# Patient Record
Sex: Female | Born: 1955
Health system: Southern US, Community
[De-identification: ages and names within clinical notes are randomized; demographics above are authoritative.]

## PROBLEM LIST (undated history)

## (undated) DIAGNOSIS — Z860100 Personal history of colon polyps, unspecified: Secondary | ICD-10-CM

## (undated) DIAGNOSIS — K219 Gastro-esophageal reflux disease without esophagitis: Secondary | ICD-10-CM

## (undated) DIAGNOSIS — Z8619 Personal history of other infectious and parasitic diseases: Secondary | ICD-10-CM

## (undated) DIAGNOSIS — Z87898 Personal history of other specified conditions: Secondary | ICD-10-CM

## (undated) DIAGNOSIS — R87619 Unspecified abnormal cytological findings in specimens from cervix uteri: Secondary | ICD-10-CM

## (undated) DIAGNOSIS — E039 Hypothyroidism, unspecified: Secondary | ICD-10-CM

## (undated) DIAGNOSIS — F419 Anxiety disorder, unspecified: Secondary | ICD-10-CM

## (undated) DIAGNOSIS — Z8719 Personal history of other diseases of the digestive system: Secondary | ICD-10-CM

## (undated) HISTORY — PX: TUBAL LIGATION: SHX77

## (undated) HISTORY — PX: BREAST BIOPSY: SHX20

## (undated) HISTORY — PX: WISDOM TOOTH EXTRACTION: SHX21

## (undated) HISTORY — DX: Hypothyroidism, unspecified: E03.9

## (undated) HISTORY — DX: Personal history of colon polyps, unspecified: Z86.0100

## (undated) HISTORY — DX: Unspecified abnormal cytological findings in specimens from cervix uteri: R87.619

## (undated) HISTORY — DX: Anxiety disorder, unspecified: F41.9

## (undated) HISTORY — DX: Personal history of colonic polyps: Z86.010

## (undated) HISTORY — DX: Personal history of other specified conditions: Z87.898

## (undated) HISTORY — DX: Personal history of other infectious and parasitic diseases: Z86.19

## (undated) HISTORY — PX: COLONOSCOPY: SHX174

---
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
## 1986-09-16 HISTORY — PX: LAPAROTOMY: SHX154

## 2005-02-12 ENCOUNTER — Ambulatory Visit: Payer: Self-pay

## 2005-02-14 ENCOUNTER — Ambulatory Visit: Payer: Self-pay

## 2006-02-20 ENCOUNTER — Ambulatory Visit: Payer: Self-pay

## 2006-11-15 HISTORY — PX: CHOLECYSTECTOMY: SHX55

## 2006-12-09 ENCOUNTER — Inpatient Hospital Stay: Payer: Self-pay | Admitting: Surgery

## 2007-04-16 ENCOUNTER — Ambulatory Visit: Payer: Self-pay

## 2008-04-19 ENCOUNTER — Ambulatory Visit: Payer: Self-pay

## 2009-04-21 ENCOUNTER — Ambulatory Visit: Payer: Self-pay

## 2010-04-25 ENCOUNTER — Ambulatory Visit: Payer: Self-pay

## 2010-09-16 DIAGNOSIS — Z8719 Personal history of other diseases of the digestive system: Secondary | ICD-10-CM

## 2010-09-16 HISTORY — DX: Personal history of other diseases of the digestive system: Z87.19

## 2011-04-30 ENCOUNTER — Ambulatory Visit: Payer: Self-pay | Admitting: Unknown Physician Specialty

## 2011-04-30 LAB — HM PAP SMEAR

## 2011-05-24 ENCOUNTER — Ambulatory Visit: Payer: Self-pay | Admitting: Unknown Physician Specialty

## 2011-05-28 LAB — PATHOLOGY REPORT

## 2011-07-16 ENCOUNTER — Ambulatory Visit: Payer: Self-pay

## 2011-07-16 LAB — HM MAMMOGRAPHY: HM MAMMO: NEGATIVE

## 2011-09-17 HISTORY — PX: OTHER SURGICAL HISTORY: SHX169

## 2012-04-06 ENCOUNTER — Ambulatory Visit: Payer: Self-pay | Admitting: Otolaryngology

## 2012-04-16 ENCOUNTER — Ambulatory Visit: Payer: Self-pay | Admitting: Otolaryngology

## 2012-04-29 LAB — HM PAP SMEAR

## 2012-04-30 ENCOUNTER — Ambulatory Visit: Payer: Self-pay | Admitting: Internal Medicine

## 2012-07-02 ENCOUNTER — Ambulatory Visit: Payer: Self-pay | Admitting: Internal Medicine

## 2012-07-06 ENCOUNTER — Ambulatory Visit: Payer: Self-pay | Admitting: Otolaryngology

## 2012-07-14 ENCOUNTER — Ambulatory Visit: Payer: Self-pay | Admitting: Internal Medicine

## 2012-07-27 ENCOUNTER — Ambulatory Visit: Payer: Self-pay

## 2013-06-08 LAB — HM PAP SMEAR

## 2013-06-10 ENCOUNTER — Encounter: Payer: Self-pay | Admitting: Internal Medicine

## 2013-06-10 ENCOUNTER — Ambulatory Visit (INDEPENDENT_AMBULATORY_CARE_PROVIDER_SITE_OTHER): Payer: 59 | Admitting: Internal Medicine

## 2013-06-10 VITALS — BP 120/70 | HR 57 | Temp 97.7°F | Ht 69.75 in | Wt 173.0 lb

## 2013-06-10 DIAGNOSIS — Z87898 Personal history of other specified conditions: Secondary | ICD-10-CM

## 2013-06-10 DIAGNOSIS — Z803 Family history of malignant neoplasm of breast: Secondary | ICD-10-CM

## 2013-06-10 DIAGNOSIS — Z86010 Personal history of colonic polyps: Secondary | ICD-10-CM

## 2013-06-10 DIAGNOSIS — Z8742 Personal history of other diseases of the female genital tract: Secondary | ICD-10-CM

## 2013-06-10 DIAGNOSIS — E039 Hypothyroidism, unspecified: Secondary | ICD-10-CM

## 2013-06-13 ENCOUNTER — Encounter: Payer: Self-pay | Admitting: Internal Medicine

## 2013-06-13 DIAGNOSIS — E039 Hypothyroidism, unspecified: Secondary | ICD-10-CM | POA: Insufficient documentation

## 2013-06-13 DIAGNOSIS — Z86010 Personal history of colonic polyps: Secondary | ICD-10-CM | POA: Insufficient documentation

## 2013-06-13 DIAGNOSIS — Z803 Family history of malignant neoplasm of breast: Secondary | ICD-10-CM | POA: Insufficient documentation

## 2013-06-13 DIAGNOSIS — R87619 Unspecified abnormal cytological findings in specimens from cervix uteri: Secondary | ICD-10-CM | POA: Insufficient documentation

## 2013-06-13 NOTE — Assessment & Plan Note (Signed)
Sister with breast cancer.  No other family members with breast cancer.  Scheduled for mammogram 07/27/13.

## 2013-06-13 NOTE — Assessment & Plan Note (Signed)
Last colonoscopy 2013.  One polyp.  Recommended a follow up colonoscopy in five years.     

## 2013-06-13 NOTE — Progress Notes (Signed)
  Subjective:    Patient ID: Meagan Calhoun, female    DOB: 01/05/1956, 57 y.o.   MRN: [id number]  HPI 57 year old female with past history of colonic polyps and hypothyroidism (s/p partial right thyroidectomy) who comes in today to follow up on these issues as well as to establish care.  Previously was seeing Dr Alison Murray and Dr Kerrie Pleasure.  She states she stays active.  Walking at lunch.  Is concerned regarding her weight.  States was 165 pounds last year.  No chest pain or tightness.  No sob.  Breathing stable.  No nausea or vomiting.  No bowel change.  Previously colonoscopy 2013 - one polyp.  Recommend f/u colonoscopy in five years.  Has a history of previous abnormal pap smear.  Repeat ok.  LMP five years ago.  Last mammogram 07/2012.  Scheduled for follow up mammogram 07/27/13.  Since her thyroid surgery her swallowing has improved.  She still occasionally has some issues with feeling a little pressure when swallowing.     Past Medical History  Diagnosis Date  . History of chicken pox   . H/O jaundice   . Hypothyroidism   . Hx of colonic polyp     Outpatient Encounter Prescriptions as of 06/10/2013  Medication Sig Dispense Refill  . Biotin 10 MG CAPS Take by mouth.      . calcium citrate (CALCITRATE - DOSED IN MG ELEMENTAL CALCIUM) 950 MG tablet Take 1 tablet by mouth daily.      . fish oil-omega-3 fatty acids 1000 MG capsule Take 2 g by mouth daily.      Marland Kitchen levothyroxine (SYNTHROID, LEVOTHROID) 100 MCG tablet Take 100 mcg by mouth daily before breakfast.      . Multiple Vitamin (MULTIVITAMIN) tablet Take 1 tablet by mouth daily.       No facility-administered encounter medications on file as of 06/10/2013.    Review of Systems Patient denies any headache, lightheadedness or dizziness.  No sinus or allergy symptoms.  No chest pain, tightness or palpitations.  No increased shortness of breath, cough or congestion.  No nausea or vomiting.  No acid reflux.  No abdominal pain or cramping.   No bowel change, such as diarrhea, constipation, BRBPR or melana.  No urine change.  Walking at lunch.  Up to date with her pelvics/paps.  Scheduled for her mammogram 07/27/13.        Objective:   Physical Exam Filed Vitals:   06/10/13 1003  BP: 120/70  Pulse: 57  Temp: 97.7 F (37.32 C)   57 year old female in no acute distress.   HEENT:  Nares- clear.  Oropharynx - without lesions. NECK:  Supple.  Nontender.  No audible bruit.  HEART:  Appears to be regular. LUNGS:  No crackles or wheezing audible.  Respirations even and unlabored.  RADIAL PULSE:  Equal bilaterally.  ABDOMEN:  Soft, nontender.  Bowel sounds present and normal.  No audible abdominal bruit.   EXTREMITIES:  No increased edema present.  DP pulses palpable and equal bilaterally.      Assessment & Plan:  HEALTH MAINTENANCE.  States up to date with her pelvic/paps.  Scheduled for her mammogram 07/27/13.  Colonoscopy 2013 - one polyp.  Recommended follow up colonoscopy in five years.    I spent 45 minutes with the patient and more than 50% of the time was spent in consultation regarding the above.

## 2013-06-13 NOTE — Assessment & Plan Note (Signed)
Previous history of abnormal pap.  Repeat ok.  Last pap wnl.  Up to date.

## 2013-06-13 NOTE — Assessment & Plan Note (Signed)
S/p partial right thyroidectomy.  On thyroid replacement.  Check tsh.

## 2013-06-15 ENCOUNTER — Telehealth: Payer: Self-pay | Admitting: Internal Medicine

## 2013-06-15 NOTE — Telephone Encounter (Signed)
Pt notified of [facility] lab results via my chart.

## 2013-06-17 NOTE — Telephone Encounter (Signed)
Mailed unread message to pt  

## 2013-06-22 ENCOUNTER — Encounter: Payer: Self-pay | Admitting: Internal Medicine

## 2013-06-29 ENCOUNTER — Encounter: Payer: Self-pay | Admitting: Internal Medicine

## 2013-07-04 ENCOUNTER — Encounter: Payer: Self-pay | Admitting: Internal Medicine

## 2013-07-04 ENCOUNTER — Telehealth: Payer: Self-pay | Admitting: Internal Medicine

## 2013-07-04 NOTE — Telephone Encounter (Signed)
Pt notified thyroid function test within normal limits and to stay on current dose of thyorid medication.   Dr Lorin Picket

## 2013-07-22 ENCOUNTER — Other Ambulatory Visit: Payer: Self-pay

## 2013-08-16 HISTORY — PX: CERVICAL BIOPSY  W/ LOOP ELECTRODE EXCISION: SUR135

## 2013-10-11 ENCOUNTER — Ambulatory Visit (INDEPENDENT_AMBULATORY_CARE_PROVIDER_SITE_OTHER): Payer: 59 | Admitting: Internal Medicine

## 2013-10-11 ENCOUNTER — Encounter: Payer: Self-pay | Admitting: Internal Medicine

## 2013-10-11 VITALS — BP 110/70 | HR 60 | Temp 98.2°F | Ht 69.75 in | Wt 172.5 lb

## 2013-10-11 DIAGNOSIS — E039 Hypothyroidism, unspecified: Secondary | ICD-10-CM

## 2013-10-11 DIAGNOSIS — Z803 Family history of malignant neoplasm of breast: Secondary | ICD-10-CM

## 2013-10-11 DIAGNOSIS — Z86010 Personal history of colonic polyps: Secondary | ICD-10-CM

## 2013-10-11 NOTE — Progress Notes (Signed)
Pre-visit discussion using our clinic review tool. No additional management support is needed unless otherwise documented below in the visit note.  

## 2013-10-11 NOTE — Assessment & Plan Note (Addendum)
Last colonoscopy 2013.  One polyp.  Recommended a follow up colonoscopy in five years.

## 2013-10-11 NOTE — Assessment & Plan Note (Signed)
Sister with breast cancer.  No other family members with breast cancer.  Had her mammogram 07/27/13.  States ok.  Obtain results.

## 2013-10-11 NOTE — Assessment & Plan Note (Signed)
S/p partial right thyroidectomy.  On thyroid replacement.  Last tsh 10/14 wnl.  Recheck tsh in 6 months to confirm stable.

## 2013-10-11 NOTE — Assessment & Plan Note (Signed)
[facility] in 11/14 abnormal.  S/p LEEP.  Ok.  Is scheduled a f/u pap in 3/15.  Obtain records.

## 2013-10-11 NOTE — Progress Notes (Signed)
  Subjective:    Patient ID: Meagan Calhoun, female    DOB: 1955-11-08, 58 y.o.   MRN: [id number]  HPI 58 year old female with past history of colonic polyps and hypothyroidism (s/p partial right thyroidectomy) who comes in today for a scheduled follow up.  She states she stays active.  Walking at lunch.  Is concerned regarding her weight.  States was 165 pounds last year.  She has just started watching her diet.  Has decreased her carb intake and her soda intake.  Feels good.  No chest pain or tightness.  No sob.  Breathing stable.  No nausea or vomiting.  No bowel change.  Previously colonoscopy 2013 - one polyp.  Recommend f/u colonoscopy in five years.  LMP five years ago.  Last mammogram 07/2013.  [facility].  Just had a pap smear in 11/14.  Was abnormal.  Is s/p LEEP.  Ok.  Scheduled a f/u pap in 3/15.  No vaginal problems.     Past Medical History  Diagnosis Date  . History of chicken pox   . H/O jaundice   . Hypothyroidism   . Hx of colonic polyp     Outpatient Encounter Prescriptions as of 10/11/2013  Medication Sig  . Biotin 10 MG CAPS Take by mouth.  . calcium citrate (CALCITRATE - DOSED IN MG ELEMENTAL CALCIUM) 950 MG tablet Take 1 tablet by mouth daily.  . fish oil-omega-3 fatty acids 1000 MG capsule Take 2 g by mouth daily.  Marland Kitchen. levothyroxine (SYNTHROID, LEVOTHROID) 100 MCG tablet Take 100 mcg by mouth daily before breakfast.  . Multiple Vitamin (MULTIVITAMIN) tablet Take 1 tablet by mouth daily.    Review of Systems Patient denies any headache, lightheadedness or dizziness.  No sinus or allergy symptoms.  No chest pain, tightness or palpitations.  No increased shortness of breath, cough or congestion.  No nausea or vomiting.  No acid reflux.  No abdominal pain or cramping.  No bowel change, such as diarrhea, constipation, BRBPR or melana.  No urine change.  Walking at lunch.  Up to date with her pelvics/paps.  Last mammogram 07/27/13.        Objective:   Physical Exam  Filed Vitals:   10/11/13 0809  BP: 110/70  Pulse: 60  Temp: 98.2 F (3736.188 C)   58 year old female in no acute distress.   HEENT:  Nares- clear.  Oropharynx - without lesions. NECK:  Supple.  Nontender.  No audible bruit.  HEART:  Appears to be regular. LUNGS:  No crackles or wheezing audible.  Respirations even and unlabored.  RADIAL PULSE:  Equal bilaterally.  ABDOMEN:  Soft, nontender.  Bowel sounds present and normal.  No audible abdominal bruit.   EXTREMITIES:  No increased edema present.  DP pulses palpable and equal bilaterally.      Assessment & Plan:  HEALTH MAINTENANCE.  Up to date with her pelvic/paps.  Last mammogram 07/27/13.  Colonoscopy 2013 - one polyp.  Recommended follow up colonoscopy in five years.

## 2013-11-03 ENCOUNTER — Encounter: Payer: Self-pay | Admitting: Internal Medicine

## 2013-12-20 ENCOUNTER — Telehealth: Payer: Self-pay | Admitting: Internal Medicine

## 2013-12-20 NOTE — Telephone Encounter (Signed)
Pt states she pulled a tick off Friday afternoon.  Not sick, does not feel sick, no temperature.  Starting last night she feels like no energy, aching feeling, freezing (sitting on heating pad at work due to being cold).  Asking for advice, wondering if symptoms would start that soon?  Call at work

## 2013-12-20 NOTE — Telephone Encounter (Signed)
Pt notified & stated that [facility] will [facility]

## 2013-12-20 NOTE — Telephone Encounter (Signed)
If aching and freezing and tick exposure - needs evaluation.  I am leaving early today - needs evaluation if can be worked in somewhere or acute care - just to confirm etiology.

## 2013-12-20 NOTE — Telephone Encounter (Signed)
Please advise 

## 2014-03-02 ENCOUNTER — Other Ambulatory Visit: Payer: Self-pay | Admitting: *Deleted

## 2014-03-02 MED ORDER — LEVOTHYROXINE SODIUM 100 MCG PO TABS: 100.0000 ug | ORAL_TABLET | Freq: Every day | ORAL | Status: DC

## 2014-04-11 ENCOUNTER — Other Ambulatory Visit: Payer: 59

## 2014-06-02 ENCOUNTER — Other Ambulatory Visit: Payer: Self-pay | Admitting: Internal Medicine

## 2014-07-29 LAB — HM MAMMOGRAPHY: HM Mammogram: NEGATIVE

## 2014-08-04 LAB — HM PAP SMEAR: HM Pap smear: NEGATIVE

## 2014-09-21 ENCOUNTER — Other Ambulatory Visit: Payer: Self-pay | Admitting: Internal Medicine

## 2014-10-12 ENCOUNTER — Encounter: Payer: 59 | Admitting: Internal Medicine

## 2014-10-24 ENCOUNTER — Encounter: Payer: Self-pay | Admitting: Internal Medicine

## 2014-10-24 ENCOUNTER — Ambulatory Visit: Payer: Self-pay | Admitting: Internal Medicine

## 2014-10-24 ENCOUNTER — Ambulatory Visit (INDEPENDENT_AMBULATORY_CARE_PROVIDER_SITE_OTHER): Payer: 59 | Admitting: Internal Medicine

## 2014-10-24 VITALS — BP 100/65 | HR 51 | Temp 98.1°F | Ht 70.5 in | Wt 172.0 lb

## 2014-10-24 DIAGNOSIS — R1310 Dysphagia, unspecified: Secondary | ICD-10-CM

## 2014-10-24 DIAGNOSIS — Z803 Family history of malignant neoplasm of breast: Secondary | ICD-10-CM

## 2014-10-24 DIAGNOSIS — R87619 Unspecified abnormal cytological findings in specimens from cervix uteri: Secondary | ICD-10-CM

## 2014-10-24 DIAGNOSIS — Z86010 Personal history of colonic polyps: Secondary | ICD-10-CM

## 2014-10-24 DIAGNOSIS — Z0000 Encounter for general adult medical examination without abnormal findings: Secondary | ICD-10-CM

## 2014-10-24 DIAGNOSIS — E0789 Other specified disorders of thyroid: Secondary | ICD-10-CM

## 2014-10-24 NOTE — Progress Notes (Signed)
Patient ID: Meagan Calhoun, female   DOB: [birth year], 59 y.o.   MRN: [id number]   Subjective:    Patient ID: Meagan Danielatricia E Kmetz, female    DOB: [birth year], 59 y.o.   MRN: [id number]  HPI  Patient on the schedule for a physical exam.  Sees gyn for breast and pelvic exams.  Here to f/u regarding her thyroid, etc.  States she has noticed difficulty swallowing.  Had an episode recently where bread did not go down.  Also had trouble with fluid not going down.  Has some trouble intermittently.  More trouble with food with more substance.  Has started eating slowly and taking small bites.  Stays active.  No cardiac symptoms with increased activity or exertion.  Bowels stable.     Past Medical History  Diagnosis Date  . History of chicken pox   . H/O jaundice   . Hypothyroidism   . Hx of colonic polyp     Current Outpatient Prescriptions on File Prior to Visit  Medication Sig Dispense Refill  . Biotin 10 MG CAPS Take by mouth.    . levothyroxine (SYNTHROID, LEVOTHROID) 100 MCG tablet Take 1 tablet (100 mcg  total) by mouth daily  before breakfast. 30 tablet 0  . Multiple Vitamin (MULTIVITAMIN) tablet Take 1 tablet by mouth daily.     No current facility-administered medications on file prior to visit.    Review of Systems  Constitutional: Negative for fatigue and unexpected weight change.  HENT: Negative for congestion and sinus pressure.   Respiratory: Negative for cough, chest tightness and shortness of breath.   Cardiovascular: Negative for chest pain, palpitations and leg swelling.  Gastrointestinal: Negative for nausea, vomiting, abdominal pain, diarrhea and constipation.       Has noticed difficulty swallowing as outlined.    Musculoskeletal: Negative for back pain and joint swelling.  Skin: Negative for color change and rash.  Neurological: Negative for dizziness, light-headedness and headaches.  Hematological: Negative for adenopathy. Does not bruise/bleed easily.    Psychiatric/Behavioral: Negative for behavioral problems. The patient is not nervous/anxious.        Objective:    Physical Exam  Constitutional: She appears well-developed and well-nourished. No distress.  HENT:  Nose: Nose normal.  Mouth/Throat: Oropharynx is clear and moist.  Neck: Neck supple. Thyromegaly (appears to have increased fullness - thyroid.  ) present.  Cardiovascular: Normal rate and regular rhythm.   Pulmonary/Chest: Breath sounds normal. No respiratory distress. She has no wheezes.  Abdominal: Soft. Bowel sounds are normal. There is no tenderness.  Musculoskeletal: She exhibits no edema or tenderness.  Lymphadenopathy:    She has no cervical adenopathy.  Skin: Skin is warm. No rash noted.    BP 100/65 mmHg  Pulse 51  Temp(Src) 98.1 F (36.7 C) (Oral)  Ht 5' 10.5" (1.791 m)  Wt 172 lb (78.019 kg)  BMI 24.32 kg/m2  SpO2 100% Wt Readings from Last 3 Encounters:  10/24/14 172 lb (78.019 kg)  10/11/13 172 lb 8 oz (78.245 kg)  06/10/13 173 lb (78.472 kg)     No results found for: WBC, HGB, HCT, PLT, GLUCOSE, CHOL, TRIG, HDL, LDLDIRECT, LDLCALC, ALT, AST, NA, K, CL, CREATININE, BUN, CO2, TSH, PSA, INR, GLUF, HGBA1C, MICROALBUR     Assessment & Plan:   Problem List Items Addressed This Visit    Abnormal Pap smear of cervix    [facility] is [facility].  States she is up to date.  Obtain records.  States  everything checked out fine.        Difficulty swallowing    Is s/p partial thyroidectomy.  Now noticing difficulty swallowing.  Occurs intermittently.  Had an episode of bread not "going down".  Also noticed with chicken and foods with increased substance.  Previously also noticed with fluid.  Obtain thyroid ultrasound.  Further w/up pending results.  Instructed to eat slowly, take small bites and chew food well.        Family history of breast cancer    Sister with breast cancer.  Stats up to date.  Obtain records.  States everything checked out fine.         Health care maintenance    Physical today.  Obtain records from gyn.  Up to date with pelvic and pap and mammogram.        History of colonic polyps    Last colonoscopy 2013.  One polyp.  Recommended a f/u colonoscopy in five years.        Thyroid fullness - Primary   Relevant Orders   US Soft Tissue Head/Neck     I spent 25 minutes with the patient and more than 50% of the time was spent in consultation regarding the above.     Dale Ellis, MD

## 2014-10-24 NOTE — Assessment & Plan Note (Signed)
[facility] is [facility].  States she is up to date.  Obtain records.  States everything checked out fine.

## 2014-10-24 NOTE — Assessment & Plan Note (Signed)
Sister with breast cancer.  Stats up to date.  Obtain records.  States everything checked out fine.

## 2014-10-24 NOTE — Assessment & Plan Note (Signed)
Physical today.  Obtain records from gyn.  Up to date with pelvic and pap and mammogram.

## 2014-10-24 NOTE — Assessment & Plan Note (Signed)
Is s/p partial thyroidectomy.  Now noticing difficulty swallowing.  Occurs intermittently.  Had an episode of bread not "going down".  Also noticed with chicken and foods with increased substance.  Previously also noticed with fluid.  Obtain thyroid ultrasound.  Further w/up pending results.  Instructed to eat slowly, take small bites and chew food well.

## 2014-10-24 NOTE — Assessment & Plan Note (Signed)
Last colonoscopy 2013.  One polyp.  Recommended a f/u colonoscopy in five years.

## 2014-10-24 NOTE — Progress Notes (Signed)
Pre visit review using our clinic review tool, if applicable. No additional management support is needed unless otherwise documented below in the visit note. 

## 2014-10-26 ENCOUNTER — Encounter: Payer: Self-pay | Admitting: Internal Medicine

## 2014-10-27 ENCOUNTER — Telehealth: Payer: Self-pay | Admitting: Internal Medicine

## 2014-10-27 ENCOUNTER — Other Ambulatory Visit: Payer: Self-pay | Admitting: *Deleted

## 2014-10-27 ENCOUNTER — Encounter: Payer: Self-pay | Admitting: *Deleted

## 2014-10-27 DIAGNOSIS — E041 Nontoxic single thyroid nodule: Secondary | ICD-10-CM

## 2014-10-27 MED ORDER — LEVOTHYROXINE SODIUM 125 MCG PO TABS: 125.0000 ug | ORAL_TABLET | Freq: Every day | ORAL | Status: DC

## 2014-10-27 NOTE — Telephone Encounter (Signed)
Pt notified of thyroid ultrasound results via my chart.  (left thyroid nodule).  Refer to endocrinology.

## 2014-10-27 NOTE — Telephone Encounter (Signed)
LMTCB & sent mychart message 

## 2014-10-27 NOTE — Telephone Encounter (Signed)
Pt has my chart, but please call her and notify her that her hgb, kidney function tests and liver function tests are wnl.  Cholesterol looks good.  Thyroid test (TSH) is elevated.  Need to confirm pt is taking her synthroid daily and taking correctly.  If so, increase the synthroid to q day.  Will need recheck tsh in 6 weeks.  I can provide another order for labs if needs.

## 2014-10-27 NOTE — Telephone Encounter (Signed)
Order placed for endocrinology referral.  

## 2014-10-27 NOTE — Telephone Encounter (Signed)
Pt notified of results. [facility] & mailed lab order to recheck TSH in 6 weeks. Pt also states that she would like to proceed with endocrinology referral. Prefers an early morning appt.

## 2014-10-29 NOTE — Addendum Note (Signed)
Addended by: Charm BargesSCOTT, Zerrick Hanssen S on: 10/29/2014 05:37 PM   Modules accepted: Orders

## 2014-10-29 NOTE — Telephone Encounter (Signed)
Order placed for referral to endocrinology.  

## 2014-11-01 NOTE — Telephone Encounter (Signed)
I spoke with patient & she reports that she has been taking the thyroid medication incorrectly. She has been taking it in the morning with the rest of her meds & vitamins. She will start tomorrow taking it by itself one hour prior to any food or other medications.

## 2014-11-02 ENCOUNTER — Other Ambulatory Visit: Payer: Self-pay | Admitting: *Deleted

## 2014-11-02 ENCOUNTER — Encounter: Payer: Self-pay | Admitting: Internal Medicine

## 2014-11-02 MED ORDER — LEVOTHYROXINE SODIUM 112 MCG PO TABS: 112.0000 ug | ORAL_TABLET | Freq: Every day | ORAL | Status: DC

## 2014-11-03 ENCOUNTER — Encounter: Payer: Self-pay | Admitting: Internal Medicine

## 2015-01-03 NOTE — Op Note (Signed)
PATIENT NAME:  Meagan Calhoun, FEAZELL MR#:  [id number] DATE OF BIRTH:  Jul 05, 1956  DATE OF PROCEDURE:  07/06/2012  PREOPERATIVE DIAGNOSIS: Right thyroid goiter.  POSTOPERATIVE DIAGNOSIS: Right thyroid goiter.   PROCEDURE PERFORMED: Minimally invasive right hemithyroidectomy with laryngeal nerve monitoring.   SURGEON: Kyung Rudd, MD   ASSISTANT:  Fredrich Birks, M.D.  ANESTHESIA: General endotracheal anesthesia.   ESTIMATED BLOOD LOSS: 25 mL.   IV FLUIDS: Please see anesthesia record.   COMPLICATIONS: None.   DRAINS/STENT PLACEMENTS: Surgicel.   SPECIMENS: Right hemithyroid and pyramidal lobe.   INDICATIONS FOR PROCEDURE: The patient is a 59 year old female with history of dysphagia and right-sided thyroid nodule on ultrasound.   OPERATIVE FINDINGS: Significantly enlarged thyroid gland with firm right-sided nodule. Right recurrent laryngeal nerve was identified and preserved. Right inferior and superior parathyroid glands were identified and preserved.   DESCRIPTION OF PROCEDURE: After the patient was identified in holding, the benefits and risks of the procedure were discussed and consent was reviewed, the patient was taken to the operating room and placed in the supine position. General endotracheal anesthesia was induced with laryngeal nerve monitoring. Care was taken to ensure proper placement of the electrodes. At this time the patient was prepped and draped in a sterile fashion. A previously marked anterior neck crease approximately two fingerbreadths above the sternal notch was injected with 5 mL of 0.25% Sensorcaine with 1:200,000 epinephrine and shoulder roll was placed. A 15 blade scalpel was used to make a skin incision. Dissection was carried through subcutaneous tissues down to the level of the strap muscles. These were divided vertically along the median raphe from the thyroid notch down to the sternum. The sternohyoid and sternothyroid muscles were separated from  the right hemithyroid. This demonstrated large goiter, right hemithyroid gland with fibrotic scarring around it. Dissection was carried laterally, inferiorly, and superiorly. A large parametal lobe was also identified coming off of the isthmus. At this time, a cleft between the trachea and the superior thyroid lobe was created using blunt techniques. The right superior pole vessels were isolated and pedicled and Harmonic scalpel was used to ligate right superior pole vessels. At this time the remaining attachments of the right hemithyroid superiorly and inferiorly were bluntly dissected. The middle thyroid vein was ligated. Berry's ligament was encountered and the right recurrent laryngeal nerve was identified coursed from lateral to medial in expected position. The stimulation of the nerve was performed to ensure that this was correct. The remaining attachments of the right hemithyroid and Berry's ligament were next divided using a combination of Harmonic scalpel and bipolar cautery and the remaining attachments of the right hemithyroid were separated from the patient's trachea using bipolar and Bovie electrocautery. At this time the patient's isthmus was divided using Bovie electrocautery and hemostasis was achieved using Bovie electrocautery. The specimen of the right hemithyroid and isthmus was passed off the table for permanent pathological evaluation. The patient's wound was copiously irrigated. The inferior parathyroid gland was identified in its normal anatomical position. Right superior parathyroid gland was identified and the recurrent laryngeal nerve continued to stimulate robustly. Surgicel was placed along the thyroid bed. Meticulous hemostasis was achieved. The strap muscles were reapproximated in a figure-of-eight fashion and then the skin was reapproximated using subdermal stitches in an interrupted fashion with 4-0 Vicryl. Skin was closed with Dermabond skin adhesive and topped with a Steri-Strip.    At this time, care of the patient was transferred to anesthesia where the patient tolerated the procedure  well and was taken to PAC-U in good condition.   ____________________________ Kyung Ruddreighton C. Reiley Bertagnolli, MD ccv:drc D: 07/06/2012 11:50:38 ET T: 07/06/2012 12:07:22 ET JOB#: [id number]  cc: Kyung Ruddreighton C. Reagyn Facemire, MD, <Dictator> Kyung RuddREIGHTON C Andrina Locken MD ELECTRONICALLY SIGNED 07/07/2012 9:38

## 2015-01-16 ENCOUNTER — Encounter: Payer: Self-pay | Admitting: Internal Medicine

## 2015-01-23 ENCOUNTER — Telehealth: Payer: Self-pay

## 2015-01-23 NOTE — Telephone Encounter (Signed)
Spoke with patient & informed her that we haven't received any labs since February. Pt states that she has a [facility] form that she found & realized that she forgot to get her TSH rechecked. Pt states that she will get it taken care of this week.

## 2015-01-23 NOTE — Telephone Encounter (Signed)
The patient is hoping to find out information about her lab work due to an upcoming biopsy (she is having it done on Wednesday)

## 2015-01-25 LAB — TSH: TSH: 1.13 u[IU]/mL (ref 0.41–5.90)

## 2015-01-26 ENCOUNTER — Encounter: Payer: Self-pay | Admitting: Internal Medicine

## 2015-02-01 ENCOUNTER — Telehealth: Payer: Self-pay | Admitting: Internal Medicine

## 2015-02-01 NOTE — Telephone Encounter (Signed)
Pt notified of normal tsh via my chart.  

## 2015-02-02 ENCOUNTER — Telehealth: Payer: Self-pay

## 2015-02-02 NOTE — Telephone Encounter (Signed)
The patient called and is hoping to find out what her next steps should be after her thyroid biopsy.  If she needs a follow up apt, when and where do you want her worked in?   Pt callback - [phone]

## 2015-02-02 NOTE — Telephone Encounter (Signed)
As far as f/u regarding her thyroid, Dr Tedd SiasSolum will dictate when due.  She did her biopsy.  I would like for her to schedule a f/u with me in 1-2 months (30min) - to f/u on her other issues.  Thanks

## 2015-02-02 NOTE — Telephone Encounter (Signed)
Left patient a message.

## 2015-03-15 ENCOUNTER — Other Ambulatory Visit: Payer: Self-pay | Admitting: Internal Medicine

## 2015-05-12 ENCOUNTER — Encounter: Payer: Self-pay | Admitting: Internal Medicine

## 2015-05-12 ENCOUNTER — Ambulatory Visit (INDEPENDENT_AMBULATORY_CARE_PROVIDER_SITE_OTHER): Payer: 59 | Admitting: Internal Medicine

## 2015-05-12 VITALS — BP 100/70 | HR 53 | Temp 98.1°F | Ht 70.5 in | Wt 167.1 lb

## 2015-05-12 DIAGNOSIS — R87619 Unspecified abnormal cytological findings in specimens from cervix uteri: Secondary | ICD-10-CM | POA: Diagnosis not present

## 2015-05-12 DIAGNOSIS — E0789 Other specified disorders of thyroid: Secondary | ICD-10-CM | POA: Diagnosis not present

## 2015-05-12 DIAGNOSIS — R1310 Dysphagia, unspecified: Secondary | ICD-10-CM

## 2015-05-12 DIAGNOSIS — E039 Hypothyroidism, unspecified: Secondary | ICD-10-CM

## 2015-05-12 DIAGNOSIS — Z86010 Personal history of colonic polyps: Secondary | ICD-10-CM

## 2015-05-12 DIAGNOSIS — Z803 Family history of malignant neoplasm of breast: Secondary | ICD-10-CM

## 2015-05-12 NOTE — Progress Notes (Signed)
Patient ID: Meagan Calhoun, female   DOB: 05-10-1956, 59 y.o.   MRN: [id number]   Subjective:    Patient ID: Meagan Calhoun, female    DOB: 1956-05-21, 59 y.o.   MRN: [id number]  HPI  Patient here for a scheduled follow up.  She tries to stay active.  Is watching her diet.  Has lost weight.  No cardiac symptoms with increased activity or exertion.  No sob.  Main complaint is that of feeling as if she is getting choked when eating.  Occurs mostly with meats and bread.  Drinks a lot of fluid when she eats.  Discussed eating slowly and taking small bites.  Has seen dr Andee Poles previously.  Also has seen Dr Markham Jordan.  No abdomina pain or cramping.  Bowels stable.     Past Medical History  Diagnosis Date  . History of chicken pox   . H/O jaundice   . Hypothyroidism   . Hx of colonic polyp    Past Surgical History  Procedure Laterality Date  . Cholecystectomy    . Thyroidectomy Right 2013    partial right thyroidectomy   Family History  Problem Relation Age of Onset  . Breast cancer Sister   . Heart disease Father     CHF  . Hypothyroidism Mother   . Colon cancer Neg Hx    Social History   Social History  . Marital Status: Married    Spouse Name: N/A  . Number of Children: N/A  . Years of Education: N/A   Social History Main Topics  . Smoking status: Never Smoker   . Smokeless tobacco: Never Used  . Alcohol Use: 0.0 oz/week    0 Standard drinks or equivalent per week  . Drug Use: No  . Sexual Activity: Not Asked   Other Topics Concern  . None   Social History Narrative    Outpatient Encounter Prescriptions as of 05/12/2015  Medication Sig  . Biotin 10 MG CAPS Take by mouth.  . Cholecalciferol (VITAMIN D PO) Take by mouth.  . Cyanocobalamin (B-12 PO) Take by mouth.  . levothyroxine (SYNTHROID, LEVOTHROID) 112 MCG tablet TAKE ONE TABLET BY MOUTH ONCE DAILY WITH BREAKFAST  . Multiple Vitamin (MULTIVITAMIN) tablet Take 1 tablet by mouth daily.   No  facility-administered encounter medications on file as of 05/12/2015.    Review of Systems  Constitutional: Negative for appetite change.       Is watching her diet.  Lost some weight.    HENT: Negative for congestion and sinus pressure.   Eyes: Negative for pain and discharge.  Respiratory: Negative for cough, chest tightness and shortness of breath.   Cardiovascular: Negative for chest pain, palpitations and leg swelling.  Gastrointestinal: Negative for nausea, vomiting, abdominal pain and diarrhea.       Reports difficulty swallowing.  Describes food getting stuck.    Genitourinary: Negative for dysuria and difficulty urinating.  Musculoskeletal: Negative for back pain and joint swelling.  Skin: Negative for color change and rash.  Neurological: Negative for dizziness, light-headedness and headaches.  Psychiatric/Behavioral: Negative for dysphoric mood and agitation.       Objective:    Physical Exam  Constitutional: She appears well-developed and well-nourished. No distress.  HENT:  Nose: Nose normal.  Mouth/Throat: Oropharynx is clear and moist.  Eyes: Conjunctivae are normal. Right eye exhibits no discharge. Left eye exhibits no discharge.  Neck: Neck supple. No thyromegaly present.  Cardiovascular: Normal rate and regular rhythm.   Pulmonary/Chest:  Breath sounds normal. No respiratory distress. She has no wheezes.  Abdominal: Soft. Bowel sounds are normal. There is no tenderness.  Musculoskeletal: She exhibits no edema or tenderness.  Lymphadenopathy:    She has no cervical adenopathy.  Skin: No rash noted. No erythema.  Psychiatric: She has a normal mood and affect. Her behavior is normal.    BP 100/70 mmHg  Pulse 53  Temp(Src) 98.1 F (36.7 C) (Oral)  Ht 5' 10.5" (1.791 m)  Wt 167 lb 2 oz (75.807 kg)  BMI 23.63 kg/m2  SpO2 99% Wt Readings from Last 3 Encounters:  05/12/15 167 lb 2 oz (75.807 kg)  10/24/14 172 lb (78.019 kg)  10/11/13 172 lb 8 oz (78.245 kg)       Lab Results  Component Value Date   WBC 4.3* 03/08/1958   HGB 13.2 18[birth year]May-1958   HCT 38 Apr 06, 1958   PLT 210 Jan 18, 1958   CHOL 167 04-04-1958   TRIG 70 07-08-58   HDL 47 1957/09/25   LDLCALC 106 1958/06/17   ALT 13 09/03/1958   AST 14 Mar 20, 1958   NA 142 12-09-57   K 4.6 October 19, 1957   CREATININE 0.9 01-18-1958   BUN 15 1958/07/08   TSH 1.13 01/25/2015       Assessment & Plan:   Problem List Items Addressed This Visit    Abnormal Pap smear of cervix    Followed at [facility].  Up to date per pt.  S/p LEEP.  Seeing Dr Tiburcio Pea.        Dysphagia - Primary    Persistent problems with feeling food getting stuck as outlined.  Discussed eating slowly and taking small bites.  Has just had thyroid evaluated with thyroid ultrasound and negative biopsy.  Refer to GI for further evaluation.        Relevant Orders   Ambulatory referral to Gastroenterology   Family history of breast cancer    Sister with breast cancer.  07/29/14 - mammogram negative.       History of colonic polyps    Colonoscopy 2013 - one polyp.  Recommended f/u colonoscopy in five years.       Hypothyroidism    On thyroid replacement.  Follow tsh.        Relevant Orders   TSH   Thyroid fullness    Thyroid ultrasound with thyroid nodule.  Biopsy 01/30/15 - negative for malignant cells.  Seeing dr Tedd Sias.  Has f/u in 07/2015.            Dale [place], MD

## 2015-05-12 NOTE — Progress Notes (Signed)
Pre visit review using our clinic review tool, if applicable. No additional management support is needed unless otherwise documented below in the visit note. 

## 2015-05-13 LAB — TSH: TSH: 9.07 u[IU]/mL — AB (ref 0.41–5.90)

## 2015-05-15 ENCOUNTER — Encounter: Payer: Self-pay | Admitting: Internal Medicine

## 2015-05-15 NOTE — Assessment & Plan Note (Signed)
Followed at [facility].  Up to date per pt.  S/p LEEP.  Seeing Dr Tiburcio Pea.

## 2015-05-15 NOTE — Assessment & Plan Note (Signed)
On thyroid replacement.  Follow tsh.  

## 2015-05-15 NOTE — Assessment & Plan Note (Signed)
Colonoscopy 2013 - one polyp.  Recommended f/u colonoscopy in five years.

## 2015-05-15 NOTE — Assessment & Plan Note (Signed)
Sister with breast cancer.  07/29/14 - mammogram negative.

## 2015-05-15 NOTE — Assessment & Plan Note (Signed)
Thyroid ultrasound with thyroid nodule.  Biopsy 01/30/15 - negative for malignant cells.  Seeing dr Tedd Sias.  Has f/u in 07/2015.

## 2015-05-15 NOTE — Assessment & Plan Note (Signed)
Persistent problems with feeling food getting stuck as outlined.  Discussed eating slowly and taking small bites.  Has just had thyroid evaluated with thyroid ultrasound and negative biopsy.  Refer to GI for further evaluation.

## 2015-05-16 ENCOUNTER — Encounter: Payer: Self-pay | Admitting: Internal Medicine

## 2015-05-23 ENCOUNTER — Encounter: Payer: Self-pay | Admitting: *Deleted

## 2015-05-23 ENCOUNTER — Telehealth: Payer: Self-pay | Admitting: *Deleted

## 2015-05-23 MED ORDER — LEVOTHYROXINE SODIUM 125 MCG PO TABS: ORAL_TABLET | ORAL | Status: DC

## 2015-05-23 NOTE — Telephone Encounter (Signed)
RX changed to 125 mcg of Levothyroxine

## 2015-06-20 ENCOUNTER — Telehealth: Payer: Self-pay | Admitting: Internal Medicine

## 2015-06-20 NOTE — Telephone Encounter (Signed)
Faxed to [facility]  At [phone].

## 2015-06-20 NOTE — Telephone Encounter (Signed)
[employer] form completed and placed on your desk.

## 2015-06-20 NOTE — Telephone Encounter (Signed)
Pt called needing the [facility] [employer] req form to take to [facility]. Pt works for [employer]. Pt wants to know if the form can be sent to [facility] on [address]? Pt wants to know does she have to fast? Thank You!

## 2015-06-20 NOTE — Telephone Encounter (Signed)
Please advise what labs you would like for her to have.  Thanks

## 2015-06-21 ENCOUNTER — Other Ambulatory Visit: Payer: Self-pay | Admitting: Internal Medicine

## 2015-06-21 ENCOUNTER — Encounter: Payer: Self-pay | Admitting: *Deleted

## 2015-06-22 ENCOUNTER — Other Ambulatory Visit: Payer: Self-pay | Admitting: Internal Medicine

## 2015-06-22 ENCOUNTER — Ambulatory Visit: Payer: 59 | Admitting: Certified Registered Nurse Anesthetist

## 2015-06-22 ENCOUNTER — Encounter
Admission: RE | Disposition: A | Discharge status: Home Health | Payer: Self-pay | Source: Ambulatory Visit | Attending: Unknown Physician Specialty

## 2015-06-22 ENCOUNTER — Encounter: Payer: Self-pay | Admitting: *Deleted

## 2015-06-22 ENCOUNTER — Ambulatory Visit
Admission: RE | Admit: 2015-06-22 | Discharge: 2015-06-22 | Disposition: A | Discharge status: Home Health | Payer: 59 | Source: Ambulatory Visit | Attending: Unknown Physician Specialty | Admitting: Unknown Physician Specialty

## 2015-06-22 DIAGNOSIS — Z79899 Other long term (current) drug therapy: Secondary | ICD-10-CM | POA: Diagnosis not present

## 2015-06-22 DIAGNOSIS — E039 Hypothyroidism, unspecified: Secondary | ICD-10-CM | POA: Diagnosis not present

## 2015-06-22 DIAGNOSIS — R1310 Dysphagia, unspecified: Secondary | ICD-10-CM | POA: Diagnosis present

## 2015-06-22 DIAGNOSIS — K222 Esophageal obstruction: Secondary | ICD-10-CM | POA: Diagnosis not present

## 2015-06-22 DIAGNOSIS — K219 Gastro-esophageal reflux disease without esophagitis: Secondary | ICD-10-CM | POA: Insufficient documentation

## 2015-06-22 HISTORY — PX: SAVORY DILATION: SHX5439

## 2015-06-22 HISTORY — DX: Gastro-esophageal reflux disease without esophagitis: K21.9

## 2015-06-22 HISTORY — DX: Personal history of other diseases of the digestive system: Z87.19

## 2015-06-22 HISTORY — PX: ESOPHAGOGASTRODUODENOSCOPY (EGD) WITH PROPOFOL: SHX5813

## 2015-06-22 LAB — TSH: TSH: 0.489 u[IU]/mL (ref 0.450–4.500)

## 2015-06-22 SURGERY — ESOPHAGOGASTRODUODENOSCOPY (EGD) WITH PROPOFOL
Anesthesia: General

## 2015-06-22 MED ORDER — BUTAMBEN-TETRACAINE-BENZOCAINE 2-2-14 % EX AERO
INHALATION_SPRAY | CUTANEOUS | Status: DC | PRN
  Administered 2015-06-22: 1 via TOPICAL

## 2015-06-22 MED ORDER — PROPOFOL 10 MG/ML IV BOLUS
INTRAVENOUS | Status: DC | PRN
  Administered 2015-06-22: 50 mg via INTRAVENOUS
  Administered 2015-06-22: 20 mg via INTRAVENOUS

## 2015-06-22 MED ORDER — PROPOFOL 500 MG/50ML IV EMUL
INTRAVENOUS | Status: DC | PRN
  Administered 2015-06-22: 120 ug/kg/min via INTRAVENOUS

## 2015-06-22 MED ORDER — GLYCOPYRROLATE 0.2 MG/ML IJ SOLN
INTRAMUSCULAR | Status: DC | PRN
  Administered 2015-06-22: 0.2 mg via INTRAVENOUS

## 2015-06-22 MED ORDER — FENTANYL CITRATE (PF) 100 MCG/2ML IJ SOLN
INTRAMUSCULAR | Status: DC | PRN
  Administered 2015-06-22: 50 ug via INTRAVENOUS

## 2015-06-22 MED ORDER — LIDOCAINE HCL (CARDIAC) 20 MG/ML IV SOLN
INTRAVENOUS | Status: DC | PRN
  Administered 2015-06-22: 100 mg via INTRAVENOUS

## 2015-06-22 MED ORDER — SODIUM CHLORIDE 0.9 % IV SOLN
INTRAVENOUS | Status: DC
  Administered 2015-06-22: 10:00:00 via INTRAVENOUS

## 2015-06-22 MED ORDER — SODIUM CHLORIDE 0.9 % IV SOLN: INTRAVENOUS | Status: DC

## 2015-06-22 NOTE — Anesthesia Preprocedure Evaluation (Signed)
Anesthesia Evaluation  Patient identified by MRN, date of birth, ID band Patient awake    Reviewed: Allergy & Precautions, NPO status , Patient's Chart, lab work & pertinent test results  History of Anesthesia Complications Negative for: history of anesthetic complications  Airway Mallampati: I       Dental  (+) Teeth Intact   Pulmonary neg pulmonary ROS,           Cardiovascular negative cardio ROS       Neuro/Psych negative neurological ROS     GI/Hepatic Neg liver ROS, GERD  Medicated,  Endo/Other  Hypothyroidism   Renal/GU negative Renal ROS     Musculoskeletal   Abdominal   Peds  Hematology   Anesthesia Other Findings   Reproductive/Obstetrics                             Anesthesia Physical Anesthesia Plan  ASA: II  Anesthesia Plan: General   Post-op Pain Management:    Induction: Intravenous  Airway Management Planned: Nasal Cannula  Additional Equipment:   Intra-op Plan:   Post-operative Plan:   Informed Consent: I have reviewed the patients History and Physical, chart, labs and discussed the procedure including the risks, benefits and alternatives for the proposed anesthesia with the patient or authorized representative who has indicated his/her understanding and acceptance.     Plan Discussed with:   Anesthesia Plan Comments:         Anesthesia Quick Evaluation

## 2015-06-22 NOTE — H&P (Signed)
   Primary Care Physician:  Dale [place], MD Primary Gastroenterologist:  Dr. Mechele Collin  Pre-Procedure History & Physical: HPI:  Meagan Calhoun is a 59 y.o. female is here for an endoscopy.   Past Medical History  Diagnosis Date  . History of chicken pox   . H/O jaundice   . Hypothyroidism   . Hx of colonic polyp   . History of esophageal stricture 2012  . GERD (gastroesophageal reflux disease)     Past Surgical History  Procedure Laterality Date  . Cholecystectomy    . Thyroidectomy Right 2013    partial right thyroidectomy    Prior to Admission medications   Medication Sig Start Date End Date Taking? Authorizing Provider  Biotin 10 MG CAPS Take by mouth.   Yes Historical Provider, MD  Cholecalciferol (VITAMIN D PO) Take by mouth.   Yes Historical Provider, MD  Cyanocobalamin (B-12 PO) Take by mouth.   Yes Historical Provider, MD  levothyroxine (SYNTHROID, LEVOTHROID) 125 MCG tablet TAKE ONE TABLET BY MOUTH ONCE DAILY WITH BREAKFAST 05/23/15  Yes Dale [place], MD  Multiple Vitamin (MULTIVITAMIN) tablet Take 1 tablet by mouth daily.   Yes Historical Provider, MD    Allergies as of 05/25/2015  . (No Known Allergies)    Family History  Problem Relation Age of Onset  . Breast cancer Sister   . Heart disease Father     CHF  . Hypothyroidism Mother   . Colon cancer Neg Hx     Social History   Social History  . Marital Status: Married    Spouse Name: N/A  . Number of Children: N/A  . Years of Education: N/A   Occupational History  . Not on file.   Social History Main Topics  . Smoking status: Never Smoker   . Smokeless tobacco: Never Used  . Alcohol Use: 0.0 oz/week    0 Standard drinks or equivalent per week  . Drug Use: No  . Sexual Activity: Not on file   Other Topics Concern  . Not on file   Social History Narrative    Review of Systems: See HPI, otherwise negative ROS  Physical Exam: BP 103/64 mmHg  Pulse 57  Temp(Src) 98.7 F (37.1 C)  (Tympanic)  Resp 12  Ht  (1.753 m)  Wt 72.576 kg (160 lb)  BMI 23.62 kg/m2  SpO2 100% General:   Alert,  pleasant and cooperative in NAD Head:  Normocephalic and atraumatic. Neck:  Supple; no masses or thyromegaly. Lungs:  Clear throughout to auscultation.    Heart:  Regular rate and rhythm. Abdomen:  Soft, nontender and nondistended. Normal bowel sounds, without guarding, and without rebound.   Neurologic:  Alert and  oriented x4;  grossly normal neurologically.  Impression/Plan: Meagan Calhoun is here for an endoscopy to be performed for dysphagia  Risks, benefits, limitations, and alternatives regarding  endoscopy have been reviewed with the patient.  Questions have been answered.  All parties agreeable.   [name], MD  06/22/2015, 10:34 AM

## 2015-06-22 NOTE — Transfer of Care (Signed)
Immediate Anesthesia Transfer of Care Note  Patient: Meagan Calhoun  Procedure(s) Performed: Procedure(s): ESOPHAGOGASTRODUODENOSCOPY (EGD) WITH PROPOFOL (N/A) SAVORY DILATION (N/A)  Patient Location: PACU  Anesthesia Type:General  Level of Consciousness: sedated  Airway & Oxygen Therapy: Patient Spontanous Breathing and Patient connected to nasal cannula oxygen  Post-op Assessment: Report given to RN and Post -op Vital signs reviewed and stable  Post vital signs: Reviewed and stable  Last Vitals:  Filed Vitals:   06/22/15 1005  BP: 103/64  Pulse: 57  Temp: 37.1 C  Resp: 12    Complications: No apparent anesthesia complications

## 2015-06-22 NOTE — Progress Notes (Signed)
Order placed for f/u tsh.  

## 2015-06-22 NOTE — Op Note (Signed)
[facility] Gastroenterology Patient Name: Meagan Calhoun Procedure Date: 06/22/2015 10:29 AM MRN: [id number] Account #: [id number] Date of Birth: Apr 28, 1956 Admit Type: Outpatient Age: 59 Room: [facility] ENDO ROOM 1 Gender: Female Note Status: Finalized Procedure:         Upper GI endoscopy Indications:       Dysphagia Providers:         Scot Jun, MD Referring MD:      Dale [place], MD (Referring MD) Medicines:         Propofol per Anesthesia Complications:     No immediate complications. Procedure:         Pre-Anesthesia Assessment:                    - After reviewing the risks and benefits, the patient was                     deemed in satisfactory condition to undergo the procedure.                    After obtaining informed consent, the endoscope was passed                     under direct vision. Throughout the procedure, the                     patient's blood pressure, pulse, and oxygen saturations                     were monitored continuously. The Olympus GIF-160 endoscope                     (S#. [id number]) was introduced through the mouth, and                     advanced to the second part of duodenum. The upper GI                     endoscopy was accomplished without difficulty. The patient                     tolerated the procedure well. Findings:      A low-grade of narrowing and mild Schatzki ring (acquired) was found in       the upper third of the esophagus. A guidewire was placed and the scope       was withdrawn. Dilation was performed with a Savary dilator with mild       resistance at 16 mm and 17 mm.      The stomach was normal.      The examined duodenum was normal. Impression:        - Low-grade of narrowing and mild Schatzki ring. Dilated.                    - Normal stomach.                    - Normal examined duodenum.                    - No specimens collected. Recommendation:    - soft food for 3 days, eat slowly,  chew well, take small                     bites. ,Resume all meds  Scot Jun, MD 06/22/2015 10:55:08 AM This report has been signed electronically. Number of Addenda: 0 Note Initiated On: 06/22/2015 10:29 AM      [facility]

## 2015-06-22 NOTE — Anesthesia Postprocedure Evaluation (Signed)
  Anesthesia Post-op Note  Patient: Meagan Calhoun  Procedure(s) Performed: Procedure(s): ESOPHAGOGASTRODUODENOSCOPY (EGD) WITH PROPOFOL (N/A) SAVORY DILATION (N/A)  Anesthesia type:General  Patient location: PACU  Post pain: Pain level controlled  Post assessment: Post-op Vital signs reviewed, Patient's Cardiovascular Status Stable, Respiratory Function Stable, Patent Airway and No signs of Nausea or vomiting  Post vital signs: Reviewed and stable  Last Vitals:  Filed Vitals:   06/22/15 1055  BP: 104/58  Pulse: 63  Temp: 36.6 C  Resp: 14    Level of consciousness: awake, alert  and patient cooperative  Complications: No apparent anesthesia complications

## 2015-06-23 ENCOUNTER — Encounter: Payer: Self-pay | Admitting: Internal Medicine

## 2015-06-23 NOTE — Telephone Encounter (Signed)
[facility] slip mailed to patient for TSH

## 2015-06-23 NOTE — Telephone Encounter (Signed)
rx for [facility] in your box.  Please send to pt.  Thanks

## 2015-06-26 ENCOUNTER — Encounter: Payer: Self-pay | Admitting: Unknown Physician Specialty

## 2015-06-27 ENCOUNTER — Other Ambulatory Visit: Payer: 59

## 2015-08-02 ENCOUNTER — Other Ambulatory Visit: Payer: 59

## 2015-08-07 ENCOUNTER — Other Ambulatory Visit: Payer: Self-pay | Admitting: Internal Medicine

## 2015-08-08 LAB — TSH: TSH: 0.214 u[IU]/mL — ABNORMAL LOW (ref 0.450–4.500)

## 2015-08-09 ENCOUNTER — Other Ambulatory Visit: Payer: Self-pay | Admitting: Internal Medicine

## 2015-08-09 ENCOUNTER — Other Ambulatory Visit: Payer: Self-pay | Admitting: Certified Nurse Midwife

## 2015-08-09 ENCOUNTER — Other Ambulatory Visit: Payer: Self-pay | Admitting: *Deleted

## 2015-08-09 DIAGNOSIS — Z1231 Encounter for screening mammogram for malignant neoplasm of breast: Secondary | ICD-10-CM

## 2015-08-09 DIAGNOSIS — Z13820 Encounter for screening for osteoporosis: Secondary | ICD-10-CM

## 2015-08-09 DIAGNOSIS — E039 Hypothyroidism, unspecified: Secondary | ICD-10-CM

## 2015-08-09 MED ORDER — LEVOTHYROXINE SODIUM 100 MCG PO TABS: ORAL_TABLET | ORAL | Status: DC

## 2015-08-09 NOTE — Progress Notes (Signed)
Order placed for f/u tsh.  

## 2015-08-21 ENCOUNTER — Encounter: Payer: Self-pay | Admitting: Nurse Practitioner

## 2015-08-21 ENCOUNTER — Telehealth: Payer: Self-pay | Admitting: Internal Medicine

## 2015-08-21 ENCOUNTER — Ambulatory Visit (INDEPENDENT_AMBULATORY_CARE_PROVIDER_SITE_OTHER): Payer: 59 | Admitting: Nurse Practitioner

## 2015-08-21 VITALS — BP 102/66 | HR 65 | Temp 98.7°F | Resp 16 | Ht 70.5 in | Wt 168.8 lb

## 2015-08-21 DIAGNOSIS — E039 Hypothyroidism, unspecified: Secondary | ICD-10-CM | POA: Diagnosis not present

## 2015-08-21 DIAGNOSIS — R002 Palpitations: Secondary | ICD-10-CM | POA: Diagnosis not present

## 2015-08-21 MED ORDER — BUSPIRONE HCL 5 MG PO TABS: 5.0000 mg | ORAL_TABLET | Freq: Two times a day (BID) | ORAL | Status: DC

## 2015-08-21 NOTE — Patient Instructions (Signed)
Try the buspirone at night time and after 7 days you can try a 2nd dosage.   Call us if anything changes and follow up with your PCP.   Seek emergency care if you are feeling worsening chest pressure, shooting pains, left side arm or jaw pain.

## 2015-08-21 NOTE — Telephone Encounter (Signed)
[facility] - [place] TELEPHONE ADVICE RECORD [facility]  Patient Name: [name]  DOB: Feb 02, 1956    Initial Comment Caller states having tightness in chest.    Nurse Assessment  Nurse: Laural BenesJohnson, RN, Dondra SpryGail Date/Time Meagan Calhoun(Eastern Time): 08/21/2015 8:48:57 AM  Confirm and document reason for call. If symptomatic, describe symptoms. ---Elease Hashimotoatricia is having chest tightness ongoing for two weeks heart racing no chest pains no radiating pain.  Has the patient traveled out of the country within the last 30 days? ---No  Does the patient have any new or worsening symptoms? ---Yes  Will a triage be completed? ---Yes  Related visit to physician within the last 2 weeks? ---No  Does the PT have any chronic conditions? (i.e. diabetes, asthma, etc.) ---No  Is this a behavioral health or substance abuse call? ---No     Guidelines    Guideline Title Affirmed Question Affirmed Notes       Final Disposition User        Comments  NOTE: 930 AM Naomie Deanarrie Doss NP for chest tightness an heart racing slight sob ongoing two weeks

## 2015-08-21 NOTE — Progress Notes (Signed)
Patient ID: Meagan Calhoun, female    DOB: August 15, 1956  Age: 59 y.o. MRN: [id number]  CC: Palpitations   HPI Meagan Calhoun presents for team health call for chest tightness and tachycardia x 2 weeks. Meagan Calhoun is acompanied by her husband today.  1) Patient reports 2 weeks of symptoms of chest tightness and tachycardia intermittently. Meagan Calhoun is having chest tightness ongoing for two weeks heart racing no chest pains no radiating pain.  Switched to Levothyroxine 100 mcg last week   Pressure, but comes and goes, just on left Walking exacerbates this occasionally Husband said Meagan Calhoun was breathing hard for a few minutes with the tightness Lasted 10-15 min Meagan Calhoun reports yesterday  No problems after church when working in yard Not daily   Martin Majestic to work and was fine this morning, but felt racing heart rate while at work.  Caffeine- No soda, no tea, Chocolate milk every morning 16 oz approx and a granola bar this morning  Onset- 2 weeks ago Location- left side of chest Duration - intermittent Characteristics- Chest tightness  Aggravating factors- laying on left side, going up stairs Relieving factors- Rest Severity- Mild   Denies treatment to date. Stressors are normal for pt, but include ill family, husband with anxiety disorder, and other life stressors.    History Meagan Calhoun has a past medical history of History of chicken pox; H/O jaundice; Hypothyroidism; colonic polyp; History of esophageal stricture (2012); and GERD (gastroesophageal reflux disease).   Meagan Calhoun has past surgical history that includes Cholecystectomy; thyroidectomy (Right, 2013); Esophagogastroduodenoscopy (egd) with propofol (N/A, 06/22/2015); and Savory dilation (N/A, 06/22/2015).   Her family history includes Breast cancer in her sister; Heart disease in her father; Hypothyroidism in her mother. There is no history of Colon cancer.Meagan Calhoun reports that Meagan Calhoun has never smoked. Meagan Calhoun has never used smokeless tobacco. Meagan Calhoun reports  that Meagan Calhoun drinks alcohol. Meagan Calhoun reports that Meagan Calhoun does not use illicit drugs.  Outpatient Prescriptions Prior to Visit  Medication Sig Dispense Refill  . Biotin 10 MG CAPS Take by mouth.    . Cholecalciferol (VITAMIN D PO) Take by mouth.    . Cyanocobalamin (B-12 PO) Take by mouth.    . levothyroxine (SYNTHROID, LEVOTHROID) 100 MCG tablet TAKE ONE TABLET BY MOUTH ONCE DAILY WITH BREAKFAST 90 tablet 0  . Multiple Vitamin (MULTIVITAMIN) tablet Take 1 tablet by mouth daily.     No facility-administered medications prior to visit.    ROS Review of Systems  Constitutional: Negative for fever, chills, diaphoresis and fatigue.  Respiratory: Positive for chest tightness. Negative for cough, shortness of breath and wheezing.   Cardiovascular: Positive for palpitations. Negative for chest pain and leg swelling.  Skin: Negative for rash.  Psychiatric/Behavioral: Negative for suicidal ideas and sleep disturbance. The patient is nervous/anxious.     Objective:  BP 102/66 mmHg  Pulse 65  Temp(Src) 98.7 F (37.1 C)  Resp 16  Ht 5' 10.5" (1.791 m)  Wt 168 lb 12.8 oz (76.567 kg)  BMI 23.87 kg/m2  SpO2 98%  Physical Exam  Constitutional: Meagan Calhoun is oriented to person, place, and time. Meagan Calhoun appears well-developed and well-nourished. No distress.  HENT:  Head: Normocephalic and atraumatic.  Right Ear: External ear normal.  Left Ear: External ear normal.  Cardiovascular: Normal rate, regular rhythm and normal heart sounds.   Pulmonary/Chest: Effort normal and breath sounds normal. No respiratory distress. Meagan Calhoun has no wheezes. Meagan Calhoun has no rales. Meagan Calhoun exhibits no tenderness.  Musculoskeletal: Normal range of motion. Meagan Calhoun exhibits no edema  or tenderness.  Neurological: Meagan Calhoun is alert and oriented to person, place, and time. No cranial nerve deficit. Meagan Calhoun exhibits normal muscle tone. Coordination normal.  Skin: Skin is warm and dry. No rash noted. Meagan Calhoun is not diaphoretic.  Psychiatric: Meagan Calhoun has a normal mood and  affect. Her behavior is normal. Judgment and thought content normal.   Assessment & Plan:   Meagan Calhoun was seen today for palpitations.  Diagnoses and all orders for this visit:  Palpitations -     EKG 12-Lead -     TSH -     T4, free -     Comp Met (CMET) -     CBC w/Diff  Other orders -     busPIRone (BUSPAR) 5 MG tablet; Take 1 tablet (5 mg total) by mouth 2 (two) times daily.   I am having Meagan Calhoun start on busPIRone. I am also having her maintain her multivitamin, Biotin, Cyanocobalamin (B-12 PO), Cholecalciferol (VITAMIN D PO), and levothyroxine.  Meds ordered this encounter  Medications  . busPIRone (BUSPAR) 5 MG tablet    Sig: Take 1 tablet (5 mg total) by mouth 2 (two) times daily.    Dispense:  60 tablet    Refill:  0    Order Specific Question:  Supervising Provider    Answer:  Crecencio Mc [[id number]]     Follow-up: Return in about 4 weeks (around 09/18/2015) for With Dr. Nicki Reaper for follow up of anxiety/Chest tightness.

## 2015-08-21 NOTE — Progress Notes (Signed)
Pre visit review using our clinic review tool, if applicable. No additional management support is needed unless otherwise documented below in the visit note. 

## 2015-08-21 NOTE — Telephone Encounter (Signed)
Seeing pt currently

## 2015-08-21 NOTE — Assessment & Plan Note (Signed)
Thyroid was low recently and levothyroxine was decreased to 100 mcg over the past two weeks. Will check TSH and T4 free at [facility] (employee) to see if trending in the correct direction. Will follow

## 2015-08-21 NOTE — Assessment & Plan Note (Addendum)
EKG is essentially NSR. No EKG prior avail for comparison. Will ask pt if she is agreeable for cardiac work up for a baseline with cardiology via MyChart. FU w/ Dr. Lorin PicketScott in approx 4 weeks. Will get Buspirone on board to see if helpful. Ruled out MSK due to no symptoms or changes with ROM exercises or palpation. RTC and when to seek emergency care outlined for pt.

## 2015-08-21 NOTE — Telephone Encounter (Signed)
Your 930 today.

## 2015-08-21 NOTE — Telephone Encounter (Signed)
Good morning! Dr Lorin PicketScott wants to see pt back around 09/18/2015 nothing avail to sch. Can you tell me where we can sch pt? And I'll call pt back. Pt would like early morning appt (8am) if preferable. Thank You!

## 2015-08-22 LAB — CBC AND DIFFERENTIAL
HEMATOCRIT: 38 % (ref 29–41)
Hemoglobin: 13.4 g/dL (ref 9.5–13.5)
NEUTROS ABS: 2 /uL
Platelets: 240 10*3/uL (ref 150–399)
WBC: 4 10^3/mL — AB (ref 5.0–15.0)

## 2015-08-22 LAB — TSH: TSH: 1.04 u[IU]/mL (ref 0.41–5.90)

## 2015-08-22 LAB — HEPATIC FUNCTION PANEL
ALK PHOS: 50 U/L (ref 25–125)
ALT: 28 U/L (ref 3–30)
AST: 26 U/L (ref 2–40)
Bilirubin, Total: 0.4 mg/dL

## 2015-08-22 LAB — BASIC METABOLIC PANEL
BUN: 15 mg/dL (ref 5–18)
Creatinine: 0.9 mg/dL (ref 0.5–1.1)
GLUCOSE: 94 mg/dL
POTASSIUM: 4.4 mmol/L (ref 3.4–5.3)
SODIUM: 140 mmol/L (ref 137–147)

## 2015-08-22 NOTE — Telephone Encounter (Signed)
Wednesday 09/20/15 @ 11:45. Thanks

## 2015-08-28 ENCOUNTER — Encounter: Payer: Self-pay | Admitting: Nurse Practitioner

## 2015-08-28 NOTE — Telephone Encounter (Signed)
Pt states that she had labs done last Monday at [facility] on [address]  Need results,  Thanks

## 2015-08-29 ENCOUNTER — Ambulatory Visit: Payer: 59

## 2015-08-29 ENCOUNTER — Inpatient Hospital Stay
Admission: RE | Admit: 2015-08-29 | Discharge: 2015-08-29 | Disposition: A | Discharge status: Home / Self Care | Payer: 59 | Source: Ambulatory Visit | Attending: Certified Nurse Midwife | Admitting: Certified Nurse Midwife

## 2015-08-29 NOTE — Telephone Encounter (Signed)
Done. Pt is scheduled for 10/26/2015 @9am .

## 2015-08-29 NOTE — Telephone Encounter (Signed)
[facility] only has lab results from 08/07/15 & her most recent labs.

## 2015-08-30 NOTE — Telephone Encounter (Signed)
Please hold until we receive results.  Thanks

## 2015-08-31 ENCOUNTER — Encounter: Payer: Self-pay | Admitting: Nurse Practitioner

## 2015-08-31 ENCOUNTER — Encounter: Payer: Self-pay | Admitting: Internal Medicine

## 2015-09-06 ENCOUNTER — Ambulatory Visit
Admission: RE | Admit: 2015-09-06 | Discharge: 2015-09-06 | Disposition: A | Discharge status: Home / Self Care | Payer: 59 | Source: Ambulatory Visit | Attending: Certified Nurse Midwife | Admitting: Certified Nurse Midwife

## 2015-09-06 ENCOUNTER — Other Ambulatory Visit: Payer: Self-pay | Admitting: Certified Nurse Midwife

## 2015-09-06 DIAGNOSIS — Z780 Asymptomatic menopausal state: Secondary | ICD-10-CM | POA: Diagnosis not present

## 2015-09-06 DIAGNOSIS — Z1231 Encounter for screening mammogram for malignant neoplasm of breast: Secondary | ICD-10-CM | POA: Diagnosis present

## 2015-09-06 DIAGNOSIS — Z13820 Encounter for screening for osteoporosis: Secondary | ICD-10-CM | POA: Diagnosis present

## 2015-10-16 ENCOUNTER — Other Ambulatory Visit: Payer: Self-pay | Admitting: Internal Medicine

## 2015-10-17 LAB — TSH: TSH: 2.6 u[IU]/mL (ref 0.450–4.500)

## 2015-10-18 ENCOUNTER — Encounter: Payer: Self-pay | Admitting: Internal Medicine

## 2015-10-26 ENCOUNTER — Ambulatory Visit (INDEPENDENT_AMBULATORY_CARE_PROVIDER_SITE_OTHER): Payer: 59 | Admitting: Internal Medicine

## 2015-10-26 ENCOUNTER — Encounter: Payer: Self-pay | Admitting: Internal Medicine

## 2015-10-26 VITALS — BP 110/70 | HR 56 | Temp 98.0°F | Resp 18 | Ht 70.5 in | Wt 170.4 lb

## 2015-10-26 DIAGNOSIS — E039 Hypothyroidism, unspecified: Secondary | ICD-10-CM | POA: Diagnosis not present

## 2015-10-26 DIAGNOSIS — M25561 Pain in right knee: Secondary | ICD-10-CM | POA: Diagnosis not present

## 2015-10-26 DIAGNOSIS — M79604 Pain in right leg: Secondary | ICD-10-CM | POA: Diagnosis not present

## 2015-10-26 DIAGNOSIS — Z86010 Personal history of colonic polyps: Secondary | ICD-10-CM

## 2015-10-26 DIAGNOSIS — R002 Palpitations: Secondary | ICD-10-CM

## 2015-10-26 MED ORDER — LEVOTHYROXINE SODIUM 100 MCG PO TABS: ORAL_TABLET | ORAL | Status: DC

## 2015-10-26 NOTE — Progress Notes (Signed)
Pre-visit discussion using our clinic review tool. No additional management support is needed unless otherwise documented below in the visit note.  

## 2015-10-26 NOTE — Progress Notes (Signed)
Patient ID: Meagan Calhoun, female   DOB: Dec 08, 1955, 60 y.o.   MRN: [id number]   Subjective:    Patient ID: Meagan Calhoun, female    DOB: 18-Feb-1956, 60 y.o.   MRN: [id number]  HPI  Patient with past history of GERD and hypothyroidism.  She comes in today to follow up on these issues.  She has started walking.  The palpitations have resolved.  States feels better since walking.  No chest pain or tightness with increased activity or exertion.  No sob.  No acid reflux.  No abdominal pain or cramping.  Bowels stable.  She started having right knee pain.  Feels tight.  Occasionally will go down her leg.  Takes advil.  Stopped walking and symptoms improved.  Tried to start back.  Still with increased pain.  Limiting her being able to walk.  No increased erythema or warmth.  No increased swelling of her calf.     Past Medical History  Diagnosis Date  . History of chicken pox   . H/O jaundice   . Hypothyroidism   . Hx of colonic polyp   . History of esophageal stricture 2012  . GERD (gastroesophageal reflux disease)    Past Surgical History  Procedure Laterality Date  . Cholecystectomy    . Thyroidectomy Right 2013    partial right thyroidectomy  . Esophagogastroduodenoscopy (egd) with propofol N/A 06/22/2015    Procedure: ESOPHAGOGASTRODUODENOSCOPY (EGD) WITH PROPOFOL;  Surgeon: Scot Jun, MD;  Location: [facility] ENDOSCOPY;  Service: Endoscopy;  Laterality: N/A;  . Savory dilation N/A 06/22/2015    Procedure: SAVORY DILATION;  Surgeon: Scot Jun, MD;  Location: [facility] ENDOSCOPY;  Service: Endoscopy;  Laterality: N/A;   Family History  Problem Relation Age of Onset  . Breast cancer Sister   . Heart disease Father     CHF  . Hypothyroidism Mother   . Colon cancer Neg Hx    Social History   Social History  . Marital Status: Married    Spouse Name: N/A  . Number of Children: N/A  . Years of Education: N/A   Social History Main Topics  . Smoking status: Never Smoker     . Smokeless tobacco: Never Used  . Alcohol Use: 0.0 oz/week    0 Standard drinks or equivalent per week  . Drug Use: No  . Sexual Activity: Not Asked   Other Topics Concern  . None   Social History Narrative    Outpatient Encounter Prescriptions as of 10/26/2015  Medication Sig  . Biotin 10 MG CAPS Take by mouth.  . busPIRone (BUSPAR) 5 MG tablet Take 1 tablet (5 mg total) by mouth 2 (two) times daily.  . Cholecalciferol (VITAMIN D PO) Take by mouth.  . Cyanocobalamin (B-12 PO) Take by mouth.  . levothyroxine (SYNTHROID, LEVOTHROID) 100 MCG tablet TAKE ONE TABLET BY MOUTH ONCE DAILY WITH BREAKFAST  . Multiple Vitamin (MULTIVITAMIN) tablet Take 1 tablet by mouth daily.  . [DISCONTINUED] levothyroxine (SYNTHROID, LEVOTHROID) 100 MCG tablet TAKE ONE TABLET BY MOUTH ONCE DAILY WITH BREAKFAST   No facility-administered encounter medications on file as of 10/26/2015.    Review of Systems  Constitutional: Negative for appetite change and unexpected weight change.  HENT: Negative for congestion and sinus pressure.   Respiratory: Negative for cough, chest tightness and shortness of breath.   Cardiovascular: Negative for chest pain, palpitations and leg swelling.  Gastrointestinal: Negative for nausea, vomiting, abdominal pain and diarrhea.  Genitourinary: Negative for dysuria and  difficulty urinating.  Musculoskeletal: Negative for back pain.       Right knee pain as outlined.    Skin: Negative for color change and rash.  Neurological: Negative for dizziness, light-headedness and headaches.  Psychiatric/Behavioral: Negative for dysphoric mood and agitation.       Objective:    Physical Exam  Constitutional: She appears well-developed and well-nourished. No distress.  HENT:  Nose: Nose normal.  Mouth/Throat: Oropharynx is clear and moist.  Eyes: Conjunctivae are normal. Right eye exhibits no discharge. Left eye exhibits no discharge.  Neck: Neck supple. No thyromegaly present.   Cardiovascular: Normal rate and regular rhythm.   Pulmonary/Chest: Breath sounds normal. No respiratory distress. She has no wheezes.  Abdominal: Soft. Bowel sounds are normal. There is no tenderness.  Musculoskeletal: She exhibits no edema.  Increased discomfort with flexion of her right knee.  Increased fullness - right popliteal region.    Lymphadenopathy:    She has no cervical adenopathy.  Skin: No rash noted. No erythema.  Psychiatric: She has a normal mood and affect. Her behavior is normal.    BP 110/70 mmHg  Pulse 56  Temp(Src) 98 F (36.7 C) (Oral)  Resp 18  Ht 5' 10.5" (1.791 m)  Wt 170 lb 6 oz (77.282 kg)  BMI 24.09 kg/m2  SpO2 97% Wt Readings from Last 3 Encounters:  10/26/15 170 lb 6 oz (77.282 kg)  08/21/15 168 lb 12.8 oz (76.567 kg)  06/22/15 160 lb (72.576 kg)     Lab Results  Component Value Date   WBC 4.0* 08/22/2015   HGB 13.4 08/22/2015   HCT 38 08/22/2015   PLT 240 08/22/2015   CHOL 167 07-12-1956   TRIG 70 09/03/[birth year]   HDL 47 01-20-1956   LDLCALC 106 1958/03/22   ALT 28 08/22/2015   AST 26 08/22/2015   NA 140 08/22/2015   K 4.4 08/22/2015   CREATININE 0.9 08/22/2015   BUN 15 08/22/2015   TSH 2.600 10/16/2015    Mm Screening Breast Tomo Bilateral  09/06/2015  CLINICAL DATA:  Screening. EXAM: DIGITAL SCREENING BILATERAL MAMMOGRAM WITH 3D TOMO WITH CAD COMPARISON:  Previous exam(s). ACR Breast Density Category b: There are scattered areas of fibroglandular density. FINDINGS: There are no findings suspicious for malignancy. Images were processed with CAD. IMPRESSION: No mammographic evidence of malignancy. A result letter of this screening mammogram will be mailed directly to the patient. RECOMMENDATION: Screening mammogram in one year. (Code:SM-B-01Y) BI-RADS CATEGORY  1: Negative. Electronically Signed   By: Ted Mcalpine M.D.   On: 09/06/2015 14:47       Assessment & Plan:   Problem List Items Addressed This Visit    None        Dale Kaibab, MD

## 2015-10-29 ENCOUNTER — Encounter: Payer: Self-pay | Admitting: Internal Medicine

## 2015-10-29 DIAGNOSIS — M25561 Pain in right knee: Secondary | ICD-10-CM | POA: Insufficient documentation

## 2015-10-29 NOTE — Assessment & Plan Note (Signed)
Colonoscopy 2013.  Recommended f/u colonoscopy in five years.   

## 2015-10-29 NOTE — Assessment & Plan Note (Signed)
Persistent right knee pain and fullness as outlined.  Check xray.  Will also check lower extremity duplex.  Taking advil.

## 2015-10-29 NOTE — Assessment & Plan Note (Signed)
Resolved with exercise.  Follow.

## 2015-10-29 NOTE — Assessment & Plan Note (Signed)
On thyroid replacement.  Follow tsh.  

## 2015-11-10 ENCOUNTER — Ambulatory Visit: Payer: 59

## 2015-11-10 ENCOUNTER — Ambulatory Visit
Admission: RE | Admit: 2015-11-10 | Discharge: 2015-11-10 | Disposition: A | Discharge status: Home / Self Care | Payer: 59 | Source: Ambulatory Visit | Attending: Internal Medicine | Admitting: Internal Medicine

## 2015-11-10 DIAGNOSIS — M79604 Pain in right leg: Secondary | ICD-10-CM | POA: Diagnosis present

## 2015-11-10 DIAGNOSIS — M25561 Pain in right knee: Secondary | ICD-10-CM | POA: Insufficient documentation

## 2015-11-10 DIAGNOSIS — M7121 Synovial cyst of popliteal space [Baker], right knee: Secondary | ICD-10-CM | POA: Diagnosis not present

## 2015-11-12 ENCOUNTER — Encounter: Payer: Self-pay | Admitting: Internal Medicine

## 2015-11-12 DIAGNOSIS — M25561 Pain in right knee: Secondary | ICD-10-CM

## 2015-11-12 NOTE — Telephone Encounter (Signed)
Order placed for ortho referral.   

## 2016-01-24 ENCOUNTER — Ambulatory Visit: Payer: 59 | Admitting: Internal Medicine

## 2016-03-25 ENCOUNTER — Encounter: Payer: Self-pay | Admitting: Internal Medicine

## 2016-03-25 DIAGNOSIS — Z13220 Encounter for screening for lipoid disorders: Secondary | ICD-10-CM

## 2016-03-25 DIAGNOSIS — E039 Hypothyroidism, unspecified: Secondary | ICD-10-CM

## 2016-03-25 DIAGNOSIS — D72819 Decreased white blood cell count, unspecified: Secondary | ICD-10-CM

## 2016-03-25 NOTE — Telephone Encounter (Signed)
Order placed for labs to be drawn at [facility] 

## 2016-04-02 LAB — CBC WITH DIFFERENTIAL/PLATELET
BASOS ABS: 0 10*3/uL (ref 0.0–0.2)
Basos: 1 %
EOS (ABSOLUTE): 0.2 10*3/uL (ref 0.0–0.4)
EOS: 5 %
HEMATOCRIT: 39.7 % (ref 34.0–46.6)
Hemoglobin: 13.3 g/dL (ref 11.1–15.9)
IMMATURE GRANULOCYTES: 0 %
Immature Grans (Abs): 0 10*3/uL (ref 0.0–0.1)
Lymphocytes Absolute: 1.4 10*3/uL (ref 0.7–3.1)
Lymphs: 38 %
MCH: 28.8 pg (ref 26.6–33.0)
MCHC: 33.5 g/dL (ref 31.5–35.7)
MCV: 86 fL (ref 79–97)
MONOCYTES: 11 %
MONOS ABS: 0.4 10*3/uL (ref 0.1–0.9)
NEUTROS PCT: 45 %
Neutrophils Absolute: 1.6 10*3/uL (ref 1.4–7.0)
Platelets: 237 10*3/uL (ref 150–379)
RBC: 4.62 x10E6/uL (ref 3.77–5.28)
RDW: 14.1 % (ref 12.3–15.4)
WBC: 3.5 10*3/uL (ref 3.4–10.8)

## 2016-04-02 LAB — LIPID PANEL
CHOLESTEROL TOTAL: 148 mg/dL (ref 100–199)
Chol/HDL Ratio: 3.5 ratio units (ref 0.0–4.4)
HDL: 42 mg/dL (ref >39)
LDL CALC: 93 mg/dL (ref 0–99)
Triglycerides: 67 mg/dL (ref 0–149)
VLDL Cholesterol Cal: 13 mg/dL (ref 5–40)

## 2016-04-02 LAB — TSH: TSH: 3.34 u[IU]/mL (ref 0.450–4.500)

## 2016-04-03 ENCOUNTER — Encounter: Payer: Self-pay | Admitting: Internal Medicine

## 2016-04-08 ENCOUNTER — Ambulatory Visit: Payer: 59 | Admitting: Internal Medicine

## 2016-06-18 ENCOUNTER — Encounter: Payer: Self-pay | Admitting: Internal Medicine

## 2016-06-25 ENCOUNTER — Encounter: Payer: Self-pay | Admitting: Internal Medicine

## 2016-06-25 ENCOUNTER — Ambulatory Visit (INDEPENDENT_AMBULATORY_CARE_PROVIDER_SITE_OTHER): Payer: 59 | Admitting: Internal Medicine

## 2016-06-25 DIAGNOSIS — Z803 Family history of malignant neoplasm of breast: Secondary | ICD-10-CM

## 2016-06-25 DIAGNOSIS — Z86010 Personal history of colonic polyps: Secondary | ICD-10-CM

## 2016-06-25 DIAGNOSIS — R002 Palpitations: Secondary | ICD-10-CM

## 2016-06-25 DIAGNOSIS — E0789 Other specified disorders of thyroid: Secondary | ICD-10-CM

## 2016-06-25 DIAGNOSIS — R1310 Dysphagia, unspecified: Secondary | ICD-10-CM

## 2016-06-25 DIAGNOSIS — R87619 Unspecified abnormal cytological findings in specimens from cervix uteri: Secondary | ICD-10-CM

## 2016-06-25 NOTE — Progress Notes (Signed)
Patient ID: Meagan Calhoun, female   DOB: 11-07-1955, 60 y.o.   MRN: [id number]   Subjective:    Patient ID: Meagan Danielatricia E Kugelman, female    DOB: 11-07-1955, 60 y.o.   MRN: [id number]  HPI  Patient here for a scheduled follow up.  She is walking 3-4 miles per day.  No chest pain.  No sob.  No significant problems with increased heart rate or palpitations.  No acid reflux.  No abdominal pain or cramping.  No bowel change.  Increased stress with her husband's issues.  Discussed with her today.     Past Medical History:  Diagnosis Date  . GERD (gastroesophageal reflux disease)   . H/O jaundice   . History of chicken pox   . History of esophageal stricture 2012  . Hx of colonic polyp   . Hypothyroidism    Past Surgical History:  Procedure Laterality Date  . CHOLECYSTECTOMY    . ESOPHAGOGASTRODUODENOSCOPY (EGD) WITH PROPOFOL N/A 06/22/2015   Procedure: ESOPHAGOGASTRODUODENOSCOPY (EGD) WITH PROPOFOL;  Surgeon: Scot Junobert T Elliott, MD;  [facility] ENDOSCOPY;  Service: Endoscopy;  Laterality: N/A;  . SAVORY DILATION N/A 06/22/2015   Procedure: SAVORY DILATION;  Surgeon: Scot Junobert T Elliott, MD;  [facility] END[facility];  Service: Endoscopy;  Laterality: N/A;  . thyroidectomy Right 2013   partial right thyroidectomy   Family History  Problem Relation Age of Onset  . Hypothyroidism Mother   . Heart disease Father     CHF  . Breast cancer Sister   . Colon cancer Neg Hx    Social History   Social History  . Marital status: Married    Spouse name: N/A  . Number of children: N/A  . Years of education: N/A   Social History Main Topics  . Smoking status: Never Smoker  . Smokeless tobacco: Never Used  . Alcohol use 0.0 oz/week  . Drug use: No  . Sexual activity: Not Asked   Other Topics Concern  . None   Social History Narrative  . None    Outpatient Encounter Prescriptions as of 06/25/2016  Medication Sig  . Biotin 10 MG CAPS Take by mouth.  . busPIRone (BUSPAR) 5 MG tablet  Take 1 tablet (5 mg total) by mouth 2 (two) times daily.  . Cholecalciferol (VITAMIN D PO) Take by mouth.  . Cyanocobalamin (B-12 PO) Take by mouth.  . levothyroxine (SYNTHROID, LEVOTHROID) 100 MCG tablet TAKE ONE TABLET BY MOUTH ONCE DAILY WITH BREAKFAST  . Multiple Vitamin (MULTIVITAMIN) tablet Take 1 tablet by mouth daily.   No facility-administered encounter medications on file as of 06/25/2016.     Review of Systems  Constitutional: Negative for appetite change and unexpected weight change.  HENT: Negative for congestion and sinus pressure.   Respiratory: Negative for cough, chest tightness and shortness of breath.   Cardiovascular: Positive for palpitations. Negative for chest pain and leg swelling.       Palpitations improved significantly.  Feels better with exercise.    Gastrointestinal: Negative for abdominal pain, diarrhea, nausea and vomiting.  Genitourinary: Negative for difficulty urinating and dysuria.  Musculoskeletal: Negative for back pain and joint swelling.  Neurological: Negative for dizziness, light-headedness and headaches.  Psychiatric/Behavioral: Negative for agitation and dysphoric mood.       Objective:    Physical Exam  Constitutional: She appears well-developed and well-nourished. No distress.  HENT:  Nose: Nose normal.  Mouth/Throat: Oropharynx is clear and moist.  Neck: Neck supple. No thyromegaly present.  Cardiovascular: Normal  rate and regular rhythm.   Pulmonary/Chest: Breath sounds normal. No respiratory distress. She has no wheezes.  Abdominal: Soft. Bowel sounds are normal. There is no tenderness.  Musculoskeletal: She exhibits no edema or tenderness.  Lymphadenopathy:    She has no cervical adenopathy.  Skin: No rash noted. No erythema.  Psychiatric: She has a normal mood and affect. Her behavior is normal.    BP 100/60   Pulse 65   Temp 98.3 F (36.8 C) (Oral)   Ht 5\' 11"  (1.803 m)   Wt 165 lb 9.6 oz (75.1 kg)   SpO2 95%   BMI  23.10 kg/m  Wt Readings from Last 3 Encounters:  06/25/16 165 lb 9.6 oz (75.1 kg)  10/26/15 170 lb 6 oz (77.3 kg)  08/21/15 168 lb 12.8 oz (76.6 kg)     Lab Results  Component Value Date   WBC 3.5 04/01/2016   HGB 13.4 08/22/2015   HCT 39.7 04/01/2016   PLT 237 04/01/2016   CHOL 148 04/01/2016   TRIG 67 04/01/2016   HDL 42 04/01/2016   LDLCALC 93 04/01/2016   ALT 28 08/22/2015   AST 26 08/22/2015   NA 140 08/22/2015   K 4.4 08/22/2015   CREATININE 0.9 08/22/2015   BUN 15 08/22/2015   TSH 3.340 04/01/2016    US Venous Img Lower Unilateral Right  Result Date: 11/10/2015 CLINICAL DATA:  Persistent right posterior knee pain and swelling for the past several weeks. Evaluate for DVT. EXAM: RIGHT LOWER EXTREMITY VENOUS DOPPLER ULTRASOUND TECHNIQUE: Gray-scale sonography with graded compression, as well as color Doppler and duplex ultrasound were performed to evaluate the lower extremity deep venous systems from the level of the common femoral vein and including the common femoral, femoral, profunda femoral, popliteal and calf veins including the posterior tibial, peroneal and gastrocnemius veins when visible. The superficial great saphenous vein was also interrogated. Spectral Doppler was utilized to evaluate flow at rest and with distal augmentation maneuvers in the common femoral, femoral and popliteal veins. COMPARISON:  None. FINDINGS: Contralateral Common Femoral Vein: Respiratory phasicity is normal and symmetric with the symptomatic side. No evidence of thrombus. Normal compressibility. Common Femoral Vein: No evidence of thrombus. Normal compressibility, respiratory phasicity and response to augmentation. Saphenofemoral Junction: No evidence of thrombus. Normal compressibility and flow on color Doppler imaging. Profunda Femoral Vein: No evidence of thrombus. Normal compressibility and flow on color Doppler imaging. Femoral Vein: No evidence of thrombus. Normal compressibility,  respiratory phasicity and response to augmentation. Popliteal Vein: No evidence of thrombus. Normal compressibility, respiratory phasicity and response to augmentation. Calf Veins: No evidence of thrombus. Normal compressibility and flow on color Doppler imaging. Superficial Great Saphenous Vein: No evidence of thrombus. Normal compressibility and flow on color Doppler imaging. Venous Reflux:  None. Other Findings: Note is made of a serpiginous approximately 6.3 x 2.0 x 3.8 cm fluid collection within the right popliteal fossa which is noted to contain thickened echogenic internal septations and favored to represent a complex Baker cyst. IMPRESSION: 1. No evidence of DVT within the right lower extremity. 2. Incidentally noted approximately 6.3 cm complex right-sided Baker cyst. Electronically Signed   By: Simonne Come M.D.   On: 11/10/2015 14:17       Assessment & Plan:   Problem List Items Addressed This Visit    Abnormal Pap smear of cervix    Sees Dr Tiburcio Pea at [facility].  D/p LEEP.        Dysphagia    Planning  for EGD and colonosocpy 08/02/16.        Family history of breast cancer    States mammogram is scheduled.        History of colonic polyps    Colonoscopy 2013.  Recommended f/u colonoscopy in five years.        Palpitations    Improved.  Exercising.  Follow.        Thyroid fullness    Thyroid ultrasound with thyroid nodule.  Biopsy 01/2015 - negative for malignant cells.  Seeing Dr Tedd Sias.         Other Visit Diagnoses   None.    I spent 25 minutes with the patient and more than 50% of the time was spent in consultation regarding the above.     Dale Lassen, MD

## 2016-06-25 NOTE — Progress Notes (Signed)
Pre visit review using our clinic review tool, if applicable. No additional management support is needed unless otherwise documented below in the visit note. 

## 2016-06-30 ENCOUNTER — Encounter: Payer: Self-pay | Admitting: Internal Medicine

## 2016-06-30 NOTE — Assessment & Plan Note (Signed)
Planning for EGD and colonosocpy 08/02/16.

## 2016-06-30 NOTE — Assessment & Plan Note (Signed)
Thyroid ultrasound with thyroid nodule.  Biopsy 01/2015 - negative for malignant cells.  Seeing Dr Tedd SiasSolum.

## 2016-06-30 NOTE — Assessment & Plan Note (Signed)
States mammogram is scheduled.

## 2016-06-30 NOTE — Assessment & Plan Note (Signed)
Sees Dr Tiburcio PeaHarris at [facility].  D/p LEEP.

## 2016-06-30 NOTE — Assessment & Plan Note (Signed)
Improved.  Exercising.  Follow.

## 2016-06-30 NOTE — Assessment & Plan Note (Signed)
Colonoscopy 2013.  Recommended f/u colonoscopy in five years.   

## 2016-07-18 ENCOUNTER — Encounter: Payer: Self-pay | Admitting: Internal Medicine

## 2016-07-23 ENCOUNTER — Other Ambulatory Visit: Payer: Self-pay | Admitting: Internal Medicine

## 2016-07-23 DIAGNOSIS — Z1231 Encounter for screening mammogram for malignant neoplasm of breast: Secondary | ICD-10-CM

## 2016-08-15 ENCOUNTER — Ambulatory Visit: Admit: 2016-08-15 | Payer: 59 | Admitting: Unknown Physician Specialty

## 2016-08-15 SURGERY — COLONOSCOPY WITH PROPOFOL
Anesthesia: General

## 2016-09-06 ENCOUNTER — Ambulatory Visit: Payer: 59

## 2016-09-10 ENCOUNTER — Ambulatory Visit
Admission: RE | Admit: 2016-09-10 | Discharge: 2016-09-10 | Disposition: A | Discharge status: Home / Self Care | Payer: 59 | Source: Ambulatory Visit | Attending: Internal Medicine | Admitting: Internal Medicine

## 2016-09-10 DIAGNOSIS — Z1231 Encounter for screening mammogram for malignant neoplasm of breast: Secondary | ICD-10-CM | POA: Diagnosis present

## 2016-09-12 ENCOUNTER — Telehealth: Payer: Self-pay | Admitting: Internal Medicine

## 2016-09-12 NOTE — Telephone Encounter (Signed)
Pt called and stated that the pharmacy faxed over a request to change pt's levothyroxine (SYNTHROID, LEVOTHROID) 100 MCG tablet to different brand. Please advise, thank you!  Pharmacy - [facility] [address] - [place], [place] - [id number]  Call pt @ [phone]

## 2016-09-12 NOTE — Telephone Encounter (Signed)
Spoke with pharmacy and gave verbal to change manufacture of medication

## 2016-09-13 ENCOUNTER — Telehealth: Payer: Self-pay

## 2016-09-13 NOTE — Telephone Encounter (Signed)
Notified [facility] Ok to Journalist, [employer] manufacturer.  Patient aware.

## 2016-09-13 NOTE — Telephone Encounter (Signed)
Levothyroxine 100 mcg we are having to change Manufacturer on this medication we need an ok from the doctor to switch patient to new manufacturer

## 2016-09-13 NOTE — Telephone Encounter (Signed)
Ok.  Just need to notify pt of change and confirm she is ok with this.

## 2016-10-24 DIAGNOSIS — R1310 Dysphagia, unspecified: Secondary | ICD-10-CM | POA: Diagnosis not present

## 2016-10-24 DIAGNOSIS — K222 Esophageal obstruction: Secondary | ICD-10-CM | POA: Diagnosis not present

## 2016-10-24 DIAGNOSIS — Z86010 Personal history of colonic polyps: Secondary | ICD-10-CM | POA: Diagnosis not present

## 2016-11-04 DIAGNOSIS — J040 Acute laryngitis: Secondary | ICD-10-CM | POA: Diagnosis not present

## 2016-11-08 ENCOUNTER — Other Ambulatory Visit: Payer: Self-pay | Admitting: Internal Medicine

## 2017-03-18 ENCOUNTER — Other Ambulatory Visit: Payer: Self-pay | Admitting: Internal Medicine

## 2017-06-25 ENCOUNTER — Encounter: Payer: Self-pay | Admitting: Internal Medicine

## 2017-06-25 ENCOUNTER — Ambulatory Visit (INDEPENDENT_AMBULATORY_CARE_PROVIDER_SITE_OTHER): Payer: 59 | Admitting: Internal Medicine

## 2017-06-25 VITALS — BP 108/62 | HR 56 | Temp 98.6°F | Resp 12 | Ht 71.0 in | Wt 168.0 lb

## 2017-06-25 DIAGNOSIS — Z86010 Personal history of colonic polyps: Secondary | ICD-10-CM

## 2017-06-25 DIAGNOSIS — E039 Hypothyroidism, unspecified: Secondary | ICD-10-CM | POA: Diagnosis not present

## 2017-06-25 DIAGNOSIS — R87619 Unspecified abnormal cytological findings in specimens from cervix uteri: Secondary | ICD-10-CM | POA: Diagnosis not present

## 2017-06-25 DIAGNOSIS — E0789 Other specified disorders of thyroid: Secondary | ICD-10-CM | POA: Diagnosis not present

## 2017-06-25 DIAGNOSIS — Z13220 Encounter for screening for lipoid disorders: Secondary | ICD-10-CM

## 2017-06-25 NOTE — Progress Notes (Signed)
Patient ID: Meagan Calhoun, female   DOB: 03-May-1956, 61 y.o.   MRN: [id number]   Subjective:    Patient ID: Meagan Calhoun, female    DOB: 1956-06-10, 61 y.o.   MRN: [id number]  HPI  Patient here for a scheduled follow up.  She is doing well.  Stays active.  No chest pain.  No sob.  No acid reflux.  No abdominal pain.  Bowels moving.  No urinary issues.  Increased stress at work, but handling things well.     Past Medical History:  Diagnosis Date  . GERD (gastroesophageal reflux disease)   . H/O jaundice   . History of chicken pox   . History of esophageal stricture 2012  . Hx of colonic polyp   . Hypothyroidism    Past Surgical History:  Procedure Laterality Date  . CHOLECYSTECTOMY    . ESOPHAGOGASTRODUODENOSCOPY (EGD) WITH PROPOFOL N/A 06/22/2015   Procedure: ESOPHAGOGASTRODUODENOSCOPY (EGD) WITH PROPOFOL;  Surgeon: Scot Jun, MD;  Location[facility] ENDOSCOPY;  Service: Endoscopy;  Laterality: N/A;  . SAVORY DILATION N/A 06/22/2015   Procedure: SAVORY DILATION;  Surgeon: Scot Jun, MD;  Location: [facility] ENDOSCOPY;  Service: Endoscopy;  Laterality: N/A;  . thyroidectomy Right 2013   partial right thyroidectomy   Family History  Problem Relation Age of Onset  . Hypothyroidism Mother   . Heart disease Father        CHF  . Breast cancer Sister   . Colon cancer Neg Hx    Social History   Social History  . Marital status: Married    Spouse name: N/A  . Number of children: N/A  . Years of education: N/A   Social History Main Topics  . Smoking status: Never Smoker  . Smokeless tobacco: Never Used  . Alcohol use 0.0 oz/week  . Drug use: No  . Sexual activity: Not Asked   Other Topics Concern  . None   Social History Narrative  . None    Outpatient Encounter Prescriptions as of 06/25/2017  Medication Sig  . Biotin 10 MG CAPS Take by mouth.  . Cholecalciferol (VITAMIN D PO) Take by mouth.  . Cyanocobalamin (B-12 PO) Take by mouth.  . levothyroxine  (SYNTHROID, LEVOTHROID) 100 MCG tablet TAKE 1 TABLET BY MOUTH ONCE DAILY WITH BREAKFAST  . [DISCONTINUED] busPIRone (BUSPAR) 5 MG tablet Take 1 tablet (5 mg total) by mouth 2 (two) times daily.  . [DISCONTINUED] Multiple Vitamin (MULTIVITAMIN) tablet Take 1 tablet by mouth daily.   No facility-administered encounter medications on file as of 06/25/2017.     Review of Systems  Constitutional: Negative for appetite change and unexpected weight change.  HENT: Negative for congestion and sinus pressure.   Respiratory: Negative for cough, chest tightness and shortness of breath.   Cardiovascular: Negative for chest pain, palpitations and leg swelling.  Gastrointestinal: Negative for abdominal pain, diarrhea, nausea and vomiting.  Genitourinary: Negative for difficulty urinating and dysuria.  Musculoskeletal: Negative for joint swelling and myalgias.  Skin: Negative for color change and rash.  Neurological: Negative for dizziness and light-headedness.  Psychiatric/Behavioral: Negative for agitation and dysphoric mood.       Handling stress.         Objective:    Physical Exam  Constitutional: She appears well-developed and well-nourished. No distress.  HENT:  Nose: Nose normal.  Mouth/Throat: Oropharynx is clear and moist.  Neck: Neck supple. No thyromegaly present.  Cardiovascular: Normal rate and regular rhythm.   Pulmonary/Chest: Breath sounds  normal. No respiratory distress. She has no wheezes.  Abdominal: Soft. Bowel sounds are normal. There is no tenderness.  Musculoskeletal: She exhibits no edema or tenderness.  Lymphadenopathy:    She has no cervical adenopathy.  Skin: No rash noted. No erythema.  Psychiatric: She has a normal mood and affect. Her behavior is normal.    BP 108/62 (BP Location: Left Arm, Patient Position: Sitting, Cuff Size: Normal)   Pulse (!) 56   Temp 98.6 F (37 C) (Oral)   Resp 12   Ht  (1.803 m)   Wt 168 lb (76.2 kg)   SpO2 98%   BMI 23.43  kg/m  Wt Readings from Last 3 Encounters:  06/25/17 168 lb (76.2 kg)  06/25/16 165 lb 9.6 oz (75.1 kg)  10/26/15 170 lb 6 oz (77.3 kg)     Lab Results  Component Value Date   WBC 3.5 04/01/2016   HGB 13.3 04/01/2016   HCT 39.7 04/01/2016   PLT 237 04/01/2016   CHOL 148 04/01/2016   TRIG 67 04/01/2016   HDL 42 04/01/2016   LDLCALC 93 04/01/2016   ALT 28 08/22/2015   AST 26 08/22/2015   NA 140 08/22/2015   K 4.4 08/22/2015   CREATININE 0.9 08/22/2015   BUN 15 08/22/2015   TSH 3.340 04/01/2016    Mm Screening Breast Tomo Bilateral  Result Date: 09/10/2016 CLINICAL DATA:  Screening. EXAM: 2D DIGITAL SCREENING BILATERAL MAMMOGRAM WITH CAD AND ADJUNCT TOMO COMPARISON:  Previous exam(s). ACR Breast Density Category b: There are scattered areas of fibroglandular density. FINDINGS: There are no findings suspicious for malignancy. Images were processed with CAD. IMPRESSION: No mammographic evidence of malignancy. A result letter of this screening mammogram will be mailed directly to the patient. RECOMMENDATION: Screening mammogram in one year. (Code:SM-B-01Y) BI-RADS CATEGORY  1: Negative. Electronically Signed   By: Edwin Cap M.D.   On: 09/10/2016 11:17       Assessment & Plan:   Problem List Items Addressed This Visit    Abnormal Pap smear of cervix    S/p LEEP.  Followed at [facility].  Due f/u in 07/2017.  Will have them forward records.        History of colonic polyps    States had colonoscopy within the last year.  Obtain results from [facility].        Hypothyroidism    On thyroid replacement.  Follow tsh.       Relevant Orders   CBC with Differential/Platelet   Comprehensive metabolic panel   TSH   Thyroid fullness    Thyroid nodule - biopsy 5/16/1 - negative.  (chronic lymphocyctic thyroiditis) (Dr Tedd Sias).  Check tsh.         Other Visit Diagnoses    Screening cholesterol level    -  Primary   Relevant Orders   Lipid panel       Dale Sayre,  MD

## 2017-06-25 NOTE — Assessment & Plan Note (Signed)
On thyroid replacement.  Follow tsh.  

## 2017-06-25 NOTE — Assessment & Plan Note (Signed)
S/p LEEP.  Followed at [facility].  Due f/u in 07/2017.  Will have them forward records.

## 2017-06-25 NOTE — Assessment & Plan Note (Signed)
States had colonoscopy within the last year.  Obtain results from [place].

## 2017-06-25 NOTE — Assessment & Plan Note (Signed)
Thyroid nodule - biopsy 5/16/1 - negative.  (chronic lymphocyctic thyroiditis) (Dr Tedd Sias).  Check tsh.

## 2017-06-27 DIAGNOSIS — E039 Hypothyroidism, unspecified: Secondary | ICD-10-CM | POA: Diagnosis not present

## 2017-06-27 DIAGNOSIS — Z13220 Encounter for screening for lipoid disorders: Secondary | ICD-10-CM | POA: Diagnosis not present

## 2017-06-29 LAB — COMPREHENSIVE METABOLIC PANEL
A/G RATIO: 1.6 (ref 1.2–2.2)
ALBUMIN: 4.6 g/dL (ref 3.5–5.5)
ALK PHOS: 52 IU/L (ref 39–117)
ALT: 10 IU/L (ref 0–32)
AST: 14 IU/L (ref 0–40)
BILIRUBIN TOTAL: 0.5 mg/dL (ref 0.0–1.2)
BUN/Creatinine Ratio: 14 (ref 9–23)
BUN: 14 mg/dL (ref 6–24)
CHLORIDE: 103 mmol/L (ref 96–106)
CO2: 24 mmol/L (ref 20–29)
Calcium: 9.3 mg/dL (ref 8.7–10.2)
Creatinine, Ser: 1.03 mg/dL — ABNORMAL HIGH (ref 0.57–1.00)
GFR calc Af Amer: 69 mL/min/{1.73_m2} (ref >59)
GFR calc non Af Amer: 60 mL/min/{1.73_m2} (ref >59)
GLUCOSE: 92 mg/dL (ref 65–99)
Globulin, Total: 2.9 g/dL (ref 1.5–4.5)
POTASSIUM: 4.7 mmol/L (ref 3.5–5.2)
SODIUM: 141 mmol/L (ref 134–144)
TOTAL PROTEIN: 7.5 g/dL (ref 6.0–8.5)

## 2017-06-29 LAB — CBC WITH DIFFERENTIAL/PLATELET
BASOS ABS: 0 10*3/uL (ref 0.0–0.2)
BASOS: 1 %
EOS (ABSOLUTE): 0.2 10*3/uL (ref 0.0–0.4)
Eos: 6 %
Hematocrit: 42.4 % (ref 34.0–46.6)
Hemoglobin: 13.7 g/dL (ref 11.1–15.9)
IMMATURE GRANS (ABS): 0 10*3/uL (ref 0.0–0.1)
Immature Granulocytes: 0 %
LYMPHS ABS: 1.4 10*3/uL (ref 0.7–3.1)
LYMPHS: 34 %
MCH: 29.3 pg (ref 26.6–33.0)
MCHC: 32.3 g/dL (ref 31.5–35.7)
MCV: 91 fL (ref 79–97)
Monocytes Absolute: 0.4 10*3/uL (ref 0.1–0.9)
Monocytes: 10 %
NEUTROS ABS: 2.1 10*3/uL (ref 1.4–7.0)
Neutrophils: 49 %
PLATELETS: 213 10*3/uL (ref 150–379)
RBC: 4.67 x10E6/uL (ref 3.77–5.28)
RDW: 14.2 % (ref 12.3–15.4)
WBC: 4.2 10*3/uL (ref 3.4–10.8)

## 2017-06-29 LAB — LIPID PANEL
CHOL/HDL RATIO: 3.8 ratio (ref 0.0–4.4)
CHOLESTEROL TOTAL: 155 mg/dL (ref 100–199)
HDL: 41 mg/dL (ref >39)
LDL Calculated: 101 mg/dL — ABNORMAL HIGH (ref 0–99)
TRIGLYCERIDES: 64 mg/dL (ref 0–149)
VLDL Cholesterol Cal: 13 mg/dL (ref 5–40)

## 2017-06-29 LAB — TSH: TSH: 1.7 u[IU]/mL (ref 0.450–4.500)

## 2017-06-30 ENCOUNTER — Encounter: Payer: Self-pay | Admitting: Internal Medicine

## 2017-07-17 ENCOUNTER — Other Ambulatory Visit: Payer: Self-pay | Admitting: Internal Medicine

## 2017-07-30 ENCOUNTER — Other Ambulatory Visit: Payer: Self-pay | Admitting: Internal Medicine

## 2017-07-30 DIAGNOSIS — Z1231 Encounter for screening mammogram for malignant neoplasm of breast: Secondary | ICD-10-CM

## 2017-08-15 ENCOUNTER — Encounter: Payer: Self-pay | Admitting: Certified Nurse Midwife

## 2017-08-15 ENCOUNTER — Ambulatory Visit (INDEPENDENT_AMBULATORY_CARE_PROVIDER_SITE_OTHER): Payer: 59 | Admitting: Certified Nurse Midwife

## 2017-08-15 VITALS — BP 98/58 | HR 54 | Ht 69.5 in | Wt 168.0 lb

## 2017-08-15 DIAGNOSIS — Z124 Encounter for screening for malignant neoplasm of cervix: Secondary | ICD-10-CM

## 2017-08-15 DIAGNOSIS — Z87898 Personal history of other specified conditions: Secondary | ICD-10-CM

## 2017-08-15 DIAGNOSIS — Z01419 Encounter for gynecological examination (general) (routine) without abnormal findings: Secondary | ICD-10-CM | POA: Diagnosis not present

## 2017-08-15 DIAGNOSIS — Z8742 Personal history of other diseases of the female genital tract: Secondary | ICD-10-CM

## 2017-08-15 NOTE — Progress Notes (Signed)
Gynecology Annual Exam  PCP: Dale DurhamScott, Charlene, MD  Chief Complaint:  Chief Complaint  Patient presents with  . Gynecologic Exam    History of Present Illness:Meagan Calhoun presents today for her annual exam. She is a 61 year old postmenopausal Caucasian/White female , G 1 P 0 0 1 0 . She is having no significant GYN problems.  She has had no spotting. Denies hot flashes  The patient's past medical history is notable for a history of Hashimoto's thyroiditis and hypothyroidism. Her current PCP is Dr Lorin PicketScott.. She also had persistent HRHPV on her Paps and had a LEEP in 2014 (path returned chronic cervicitis). Her Pap smears have been normal since then.  Since her last annual GYN exam dated 08/14/2016 , she has had no significant changes in her health history. She has had a nonproductive cough for 3 weeks. She feels that the cough is not as bad as it was. It is worse in the morning and at night when lying down. Denies heartburn/ reflux symptoms. Has been using cough drops and a honey/whiskey drink at night. Denies fever.  She is sexually active. She does not have vaginal dryness.   Her most recent pap smear was obtained 08/14/2016 and was RNIL.  Her most recent mammogram obtained on 09/11/2016 was normal. There is a positive history of breast cancer in her sister. Genetic testing has not been done. There is no family history of ovarian cancer. The patient does do monthly self breast exams.  She had a colonoscopy in 08/15/2016 that showed a non-cancerous polyp. Next one due in 5 years  She had a recent DEXA scan obtained in 2016 that was normal.  The patient does not smoke.  The patient does drink infrequently.  The patient does not use illegal drugs.  The patient exercises by walking and working in the yard.  The patient does get adequate calcium in her diet.  She had a recent cholesterol screen in 2018 that was normal.    The patient denies current symptoms of depression.     Review of Systems: Review of Systems  Constitutional: Negative for chills, fever and weight loss.  HENT: Negative for congestion, sinus pain and sore throat.   Eyes: Negative for blurred vision and pain.  Respiratory: Positive for cough. Negative for hemoptysis, shortness of breath and wheezing.   Cardiovascular: Negative for chest pain, palpitations and leg swelling.  Gastrointestinal: Negative for abdominal pain, blood in stool, diarrhea, heartburn, nausea and vomiting.  Genitourinary: Negative for dysuria, frequency, hematuria and urgency.  Musculoskeletal: Negative for back pain, joint pain and myalgias.  Skin: Negative for itching and rash.  Neurological: Negative for dizziness, tingling and headaches.  Endo/Heme/Allergies: Negative for environmental allergies and polydipsia. Does not bruise/bleed easily.       Negative for hirsutism   Psychiatric/Behavioral: Negative for depression. The patient is not nervous/anxious and does not have insomnia.     Past Medical History:  Past Medical History:  Diagnosis Date  . Abnormal Pap smear of cervix   . GERD (gastroesophageal reflux disease)   . H/O jaundice   . History of chicken pox   . History of esophageal stricture 2012  . Hx of colonic polyp   . Hypothyroidism     Past Surgical History:  Past Surgical History:  Procedure Laterality Date  . BREAST BIOPSY Left   . CERVICAL BIOPSY  W/ LOOP ELECTRODE EXCISION  08/2013   chronic cervicitis  . CHOLECYSTECTOMY  11/2006  Dr Renda Rolls  . COLONOSCOPY  2012, 2017   normal  . ESOPHAGOGASTRODUODENOSCOPY (EGD) WITH PROPOFOL N/A 06/22/2015   Procedure: ESOPHAGOGASTRODUODENOSCOPY (EGD) WITH PROPOFOL;  Surgeon: Scot Jun, MD;  Location: [facility] ENDOSCOPY;  Service: Endoscopy;  Laterality: N/A;  . LAPAROTOMY  1988   ectopic pregnancy  . SAVORY DILATION N/A 06/22/2015   Procedure: SAVORY DILATION;  Surgeon: Scot Jun, MD;  Location: [facility] ENDOSCOPY;  Service: Endoscopy;   Laterality: N/A;  . thyroidectomy Right 2013   partial right thyroidectomy  . TUBAL LIGATION    . WISDOM TOOTH EXTRACTION      Family History:  Family History  Problem Relation Age of Onset  . Hypothyroidism Mother   . Heart disease Father        CHF  . Breast cancer Sister 22  . Alzheimer's disease Sister   . Colon cancer Neg Hx     Social History:  Social History   Socioeconomic History  . Marital status: Married    Spouse name: Not on file  . Number of children: 0  . Years of education: 50  . Highest education level: Not on file  Social Needs  . Financial resource strain: Not on file  . Food insecurity - worry: Not on file  . Food insecurity - inability: Not on file  . Transportation needs - medical: Not on file  . Transportation needs - non-medical: Not on file  Occupational History  . Not on file  Tobacco Use  . Smoking status: Never Smoker  . Smokeless tobacco: Never Used  Substance and Sexual Activity  . Alcohol use: Yes    Alcohol/week: 0.0 oz  . Drug use: No  . Sexual activity: Yes    Partners: Male    Birth control/protection: Post-menopausal  Other Topics Concern  . Not on file  Social History Narrative  . Not on file    Allergies:  No Known Allergies  Medications: Prior to Admission medications   Medication Sig Start Date End Date Taking? Authorizing Provider  Biotin 10 MG CAPS Take by mouth.    [provider]  Cholecalciferol (VITAMIN D PO) Take by mouth.    [provider]  Cyanocobalamin (B-12 PO) Take by mouth.    [provider]  levothyroxine (SYNTHROID, LEVOTHROID) 100 MCG tablet TAKE 1 TABLET BY MOUTH ONCE DAILY WITH BREAKFAST 07/17/17   Dale Satsop, MD    Physical Exam Vitals: BP (!) 98/58   Pulse (!) 54   Ht 5' 9.5" (1.765 m)   Wt 168 lb (76.2 kg)   BMI 24.45 kg/m   General: WF in NAD, non productive cough occasionally HEENT: normocephalic, anicteric  OP: no exudates or inflammation  Nasal  turbinates: inflamed Neck: no thyroid enlargement, no palpable nodules, no cervical lymphadenopathy  Pulmonary: No increased work of breathing, CTAB Cardiovascular: RRR, without murmur  Breast: Breast symmetrical, no tenderness, no palpable nodules or masses, no skin or nipple retraction present, no nipple discharge.  No axillary, infraclavicular or supraclavicular lymphadenopathy. Abdomen: Soft, non-tender, non-distended.  Umbilicus without lesions.  No hepatomegaly or masses palpable. No evidence of hernia. Genitourinary:  External: Normal external female genitalia.  Normal urethral meatus, normal Bartholin's and Skene's glands.    Vagina: Normal vaginal mucosa, no evidence of prolapse.    Cervix: Grossly normal in appearance, no bleeding, non-tender  Uterus: Anteverted, small, mobile, and non-tender  Adnexa: No adnexal masses, non-tender  Rectal: deferred  Lymphatic: no evidence of inguinal lymphadenopathy Extremities: no edema,  erythema, or tenderness Neurologic: Grossly intact Psychiatric: mood appropriate, affect full     Assessment: 61 y.o. annual gyn exam History of abnormal Pap smear with LEEP in 2014 Cough-probably due to URI  Plan:   1) Breast cancer screening - recommend monthly self breast exam and annual mammograms. PCP ordered mammogram and it is scheduled for later this month. LIfetime risk of breast cancer 14.9%.   2) Colon cancer screening UTD-next due 2022.  3) Cervical cancer screening - Pap was done.   4) Osteoporosis prevention: discussed calcium and vitamin D3 requirements. Explained role of exercise in preventing osteoporosis  5) Routine healthcare maintenance including cholesterol and diabetes screening UTD   6) RTO 1 year and prn  Farrel Connersolleen Cheyan Frees, CNM

## 2017-08-16 ENCOUNTER — Encounter: Payer: Self-pay | Admitting: Certified Nurse Midwife

## 2017-08-26 LAB — IGP,CTNG,APTIMAHPV
CHLAMYDIA, NUC. ACID AMP: NEGATIVE
GONOCOCCUS BY NUCLEIC ACID AMP: NEGATIVE
HPV Aptima: NEGATIVE
PAP Smear Comment: 0

## 2017-08-28 ENCOUNTER — Encounter: Payer: Self-pay | Admitting: Certified Nurse Midwife

## 2017-09-15 ENCOUNTER — Ambulatory Visit
Admission: RE | Admit: 2017-09-15 | Discharge: 2017-09-15 | Disposition: A | Discharge status: Home / Self Care | Payer: 59 | Source: Ambulatory Visit | Attending: Internal Medicine | Admitting: Internal Medicine

## 2017-09-15 DIAGNOSIS — Z1231 Encounter for screening mammogram for malignant neoplasm of breast: Secondary | ICD-10-CM | POA: Diagnosis present

## 2017-09-24 ENCOUNTER — Encounter: Payer: Self-pay | Admitting: Internal Medicine

## 2017-09-25 NOTE — Telephone Encounter (Signed)
Please call pt.  See if she is having any other symptoms (I.e., fever, body aches, etc).   It is hard to tell her what to do about a rash - without being able to see the rash.  Really should be seen to try to be able to treat appropriately.  If declines evaluation, with itching and rash - can try a cortisone cream (otc), but again unable to know for sure what is needed.

## 2017-11-21 ENCOUNTER — Telehealth: Payer: Self-pay | Admitting: Internal Medicine

## 2017-11-24 NOTE — Telephone Encounter (Signed)
Caller Name: Elease Hashimotoatricia, self Phone:  [phone]  Pharmacy:  [facility] [address] - [place], [place] - [id number] [phone] (Phone) [phone] (Fax)     Pt states [facility] received the [facility] for levothyroxine but there is a change of manufacturer and they must speak with someone to authorize approval for the change before they can dispense medication. Pt will be out tomorrow.

## 2017-11-24 NOTE — Telephone Encounter (Signed)
Spoke with [facility] and gave ok to switch manufacturer.

## 2017-12-22 ENCOUNTER — Encounter: Payer: Self-pay | Admitting: Internal Medicine

## 2017-12-25 ENCOUNTER — Telehealth: Payer: Self-pay | Admitting: Internal Medicine

## 2017-12-25 NOTE — Telephone Encounter (Signed)
Left  Another  Voice mail   On  Answering  Machine  Regarding   Rx  Refill

## 2017-12-25 NOTE — Telephone Encounter (Signed)
Message that  rx for the  [facility]othyroxine refill [facility] [address]  In [place] and she can call back for any furthur  questians

## 2017-12-25 NOTE — Telephone Encounter (Signed)
Copied from CRM [id number]. Topic: Quick Communication - Rx Refill/Question >> Dec 25, 2017  2:57 PM Leafy Roobinson, Norma J wrote: Medication:levothyroxine 100 mcg #90 Has the patient contacted their pharmacy no. New pharm [facility] [address] [phone]

## 2017-12-29 ENCOUNTER — Other Ambulatory Visit: Payer: Self-pay

## 2017-12-29 MED ORDER — LEVOTHYROXINE SODIUM 100 MCG PO TABS: ORAL_TABLET | ORAL | 3 refills | Status: DC

## 2018-04-21 ENCOUNTER — Other Ambulatory Visit: Payer: Self-pay | Admitting: *Deleted

## 2018-04-21 MED ORDER — LEVOTHYROXINE SODIUM 100 MCG PO TABS: ORAL_TABLET | ORAL | 2 refills | Status: DC

## 2018-04-27 ENCOUNTER — Encounter: Payer: Self-pay | Admitting: Family Medicine

## 2018-04-27 ENCOUNTER — Telehealth: Payer: Self-pay | Admitting: Internal Medicine

## 2018-04-27 ENCOUNTER — Ambulatory Visit: Payer: 59 | Admitting: Family Medicine

## 2018-04-27 VITALS — BP 108/78 | HR 56 | Temp 98.6°F | Ht 69.0 in | Wt 170.0 lb

## 2018-04-27 DIAGNOSIS — F4329 Adjustment disorder with other symptoms: Secondary | ICD-10-CM | POA: Diagnosis not present

## 2018-04-27 DIAGNOSIS — F419 Anxiety disorder, unspecified: Secondary | ICD-10-CM | POA: Diagnosis not present

## 2018-04-27 DIAGNOSIS — F5102 Adjustment insomnia: Secondary | ICD-10-CM | POA: Diagnosis not present

## 2018-04-27 MED ORDER — TRAZODONE HCL 50 MG PO TABS: 50.0000 mg | ORAL_TABLET | Freq: Every evening | ORAL | 5 refills | Status: DC | PRN

## 2018-04-27 NOTE — Telephone Encounter (Signed)
Patient has been scheduled with Leotis ShamesLauren today

## 2018-04-27 NOTE — Patient Instructions (Signed)
Great to meet you!  Take trazodone about 30 min before bed to help you sleep

## 2018-04-27 NOTE — Telephone Encounter (Signed)
Copied from CRM [id number]. Topic: Quick Communication - See Telephone Encounter >> Apr 27, 2018 10:04 AM Luanna Coleawoud, Jessica L wrote: CRM for notification. See Telephone encounter for: 04/27/18. Pt would like to be worked in asap. Pt called and stated that she had to put mother in an assisted living and is very stressed out and not sleeping. Pt would like something for not sleeping. Please advise

## 2018-04-27 NOTE — Telephone Encounter (Signed)
Patient wants to be worked in

## 2018-04-28 ENCOUNTER — Encounter: Payer: Self-pay | Admitting: Family Medicine

## 2018-04-28 NOTE — Progress Notes (Signed)
Subjective:    Patient ID: Meagan Calhoun, female    DOB: 01/14/1956, 62 y.o.   MRN: [id number]  HPI   Patient presents to clinic due to extreme stress, anxiety and insomnia.  She has been dealing with her elderly mother who has dementia -mother lived in [place], but was admitted to hospital.  It was then decided she needed to go into a nursing home facility.  Patient was driving back and forth to [place] every day for a week, and discharge coordinator was able to get her mother a bed in the [place] area.  Now that mother is in the nursing home in [place], patient has been trying to visit her often.  However, mother's dementia is worsening and the mother is having episodes of anger and violent outbursts. She has also fallen 2 times. These have been very difficult for patient to watch, and nurses at the nursing home have been trying to reassure the patient they are doing the best they can for her mom and her mom is adjusting to living in the facility.  Patient has been unable to sleep because she is afraid her mom will fall or she will get a call in the middle of the night & have to run to the hospital.  Patient is extremely exhausted.  Patient states she knows she is having anxiety due to the stress in her life.  Denies any suicidal ideation.   Patient Active Problem List   Diagnosis Date Noted  . Right knee pain 10/29/2015  . Palpitations 08/21/2015  . Dysphagia 05/12/2015  . Thyroid fullness 10/24/2014  . Difficulty swallowing 10/24/2014  . Health care maintenance 10/24/2014  . History of colonic polyps 06/13/2013  . Abnormal Pap smear of cervix 06/13/2013  . Hypothyroidism 06/13/2013  . Family history of breast cancer 06/13/2013   Social History   Tobacco Use  . Smoking status: Never Smoker  . Smokeless tobacco: Never Used  Substance Use Topics  . Alcohol use: Yes    Alcohol/week: 0.0 standard drinks    Review of Systems  Constitutional: Negative for chills, fatigue  and fever.  HENT: Negative for congestion, ear pain, sinus pain and sore throat.   Eyes: Negative.   Respiratory: Negative for cough, shortness of breath and wheezing.   Cardiovascular: Negative for chest pain, palpitations and leg swelling.  Gastrointestinal: Negative for abdominal pain, diarrhea, nausea and vomiting.  Genitourinary: Negative for dysuria, frequency and urgency.  Musculoskeletal: Negative for arthralgias and myalgias.  Skin: Negative for color change, pallor and rash.  Neurological: Negative for syncope, light-headedness and headaches.  Psychiatric/Behavioral: The patient is anxious & reports unable to sleep due to stress.     Objective:   Physical Exam Physical Exam  Constitutional: She is oriented to person, place, and time. She appears well-developed and well-nourished. No distress.  HENT:  Head: Normocephalic and atraumatic.  Eyes: Pupils are equal, round, and reactive to light. EOM are normal. No scleral icterus.  Cardiovascular: Normal rate, regular rhythm and normal heart sounds.  Pulmonary/Chest: Effort normal and breath sounds normal. No respiratory distress. She has no wheezes. She has no rales.  Neurological: She is alert and oriented to person, place, and time.  Gait normal  Skin: Skin is warm and dry. No pallor.  Psychiatric: She has a normal mood and affect. Her behavior is normal. Thought content normal. +Anxiety when talking about her stress and worrying about her mother. Nursing note and vitals reviewed.  Vitals:   04/27/18 1626  BP: 108/78  Pulse: (!) 56  Temp: 98.6 F (37 C)  SpO2: 97%      Assessment & Plan:    A total of 25  minutes were spent face-to-face with the patient during this encounter and over half of that time was spent on counseling and coordination of care. The patient was counseled on anxiety, medication.   Insomnia -patient will trial low-dose trazodone 30 minutes before bed to help her sleep.  Anxiety -reassured patient  she is doing everything she can to help her mother, and her mother being in a skilled nursing facility with 24-hour staffing is the best place for her.  Patient also reassured that she must also take care of herself to be sure she is getting good sleep so she can be a good daughter, good wife, sister and feel good about herself.  Patient has a vacation coming up the end of this week to the beach and is planning to go, she hopes this will be relaxing.  Keep follow up already scheduled in October 2019, offered sooner appt to recheck but patient declines at this time and will call if needs sooner. Follow up sooner if symptoms do not improve or worsen.

## 2018-06-29 ENCOUNTER — Ambulatory Visit (INDEPENDENT_AMBULATORY_CARE_PROVIDER_SITE_OTHER): Payer: 59 | Admitting: Internal Medicine

## 2018-06-29 ENCOUNTER — Encounter: Payer: Self-pay | Admitting: Internal Medicine

## 2018-06-29 VITALS — BP 96/56 | HR 52 | Temp 97.7°F | Ht 69.0 in | Wt 168.2 lb

## 2018-06-29 DIAGNOSIS — R079 Chest pain, unspecified: Secondary | ICD-10-CM

## 2018-06-29 DIAGNOSIS — F439 Reaction to severe stress, unspecified: Secondary | ICD-10-CM | POA: Insufficient documentation

## 2018-06-29 DIAGNOSIS — R87619 Unspecified abnormal cytological findings in specimens from cervix uteri: Secondary | ICD-10-CM

## 2018-06-29 DIAGNOSIS — Z0000 Encounter for general adult medical examination without abnormal findings: Secondary | ICD-10-CM | POA: Diagnosis not present

## 2018-06-29 DIAGNOSIS — E039 Hypothyroidism, unspecified: Secondary | ICD-10-CM | POA: Diagnosis not present

## 2018-06-29 MED ORDER — SERTRALINE HCL 50 MG PO TABS: ORAL_TABLET | ORAL | 1 refills | Status: DC

## 2018-06-29 NOTE — Assessment & Plan Note (Signed)
S/p LEEP.  Followed at [facility].  Last evaluated 07/2017

## 2018-06-29 NOTE — Assessment & Plan Note (Signed)
Chest pain as outlined.  EKG - SR with no acute ischemic changes.  Given persistent intermittent episodes will have cardiology evaluate for question of need for further w/up.

## 2018-06-29 NOTE — Progress Notes (Signed)
Patient ID: Meagan Calhoun, female   DOB: 1956-02-13, 62 y.o.   MRN: [id number]   Subjective:    Patient ID: Meagan Calhoun, female    DOB: 05/18/1956, 62 y.o.   MRN: [id number]  HPI  Patient here for her physical exam.  Increased stress.  Recently her mother was found to be unresponsive.  Diagnosed with pneumonia.  Back in nursing facility.  Her brother-n-law passed away unexpectedly and her sister-n-law has alzheimers.  Discussed with her today.  She feels she needs something to help level things out.  She also reports having chest pain.  Intermittent.  Had one "bad episode" where she describes chest pain that extended up her jaw and down her left arm.  Sat up and had to take deep breaths.  Resolved after she started the deep breathing.  No acid reflux.  No abdominal pain.  Some decreased appetite.  Bowels moving.  Was seen at [facility] 07/2017.     Past Medical History:  Diagnosis Date  . Abnormal Pap smear of cervix   . GERD (gastroesophageal reflux disease)   . H/O jaundice   . History of chicken pox   . History of esophageal stricture 2012  . Hx of colonic polyp   . Hypothyroidism    Past Surgical History:  Procedure Laterality Date  . BREAST BIOPSY Left    neg  . CERVICAL BIOPSY  W/ LOOP ELECTRODE EXCISION  08/2013   chronic cervicitis  . CHOLECYSTECTOMY  11/2006   Dr Renda Rolls  . COLONOSCOPY  2012, 2017   normal  . ESOPHAGOGASTRODUODENOSCOPY (EGD) WITH PROPOFOL N/A 06/22/2015   Procedure: ESOPHAGOGASTRODUODENOSCOPY (EGD) WITH PROPOFOL;  Surgeon: Scot Jun, MD;  Location: [facility] ENDOSCOPY;  Service: Endoscopy;  Laterality: N/A;  . LAPAROTOMY  1988   ectopic pregnancy  . SAVORY DILATION N/A 06/22/2015   Procedure: SAVORY DILATION;  Surgeon: Scot Jun, MD;  Location: [facility] ENDOSCOPY;  Service: Endoscopy;  Laterality: N/A;  . thyroidectomy Right 2013   partial right thyroidectomy  . TUBAL LIGATION    . WISDOM TOOTH EXTRACTION     Family History  Problem  Relation Age of Onset  . Hypothyroidism Mother   . Heart disease Father        CHF  . Breast cancer Sister 33  . Alzheimer's disease Sister   . Colon cancer Neg Hx    Social History   Socioeconomic History  . Marital status: Married    Spouse name: Not on file  . Number of children: 0  . Years of education: 4  . Highest education level: Not on file  Occupational History  . Not on file  Social Needs  . Financial resource strain: Not on file  . Food insecurity:    Worry: Not on file    Inability: Not on file  . Transportation needs:    Medical: Not on file    Non-medical: Not on file  Tobacco Use  . Smoking status: Never Smoker  . Smokeless tobacco: Never Used  Substance and Sexual Activity  . Alcohol use: Yes    Alcohol/week: 0.0 standard drinks  . Drug use: No  . Sexual activity: Yes    Partners: Male    Birth control/protection: Post-menopausal  Lifestyle  . Physical activity:    Days per week: 4 days    Minutes per session: 30 min  . Stress: Not at all  Relationships  . Social connections:    Talks on phone: More than three  times a week    Gets together: Once a week    Attends religious service: More than 4 times per year    Active member of club or organization: No    Attends meetings of clubs or organizations: Never    Relationship status: Married  Other Topics Concern  . Not on file  Social History Narrative  . Not on file    Outpatient Encounter Medications as of 06/29/2018  Medication Sig  . Biotin 10 MG CAPS Take by mouth.  . levothyroxine (SYNTHROID, LEVOTHROID) 100 MCG tablet TAKE 1 TABLET BY MOUTH ONCE DAILY WITH BREAKFAST. NEED APPT & LABS FOR ADDITIONAL REFILLS  . sertraline (ZOLOFT) 50 MG tablet Take 1/2 tablet q day for one week and then one tablet q day.  . [DISCONTINUED] Cholecalciferol (VITAMIN D3) 2000 units capsule Take by mouth.  . [DISCONTINUED] Cyanocobalamin (VITAMIN B 12) 100 MCG LOZG Take by mouth.  . [DISCONTINUED] traZODone  (DESYREL) 50 MG tablet Take 1 tablet (50 mg total) by mouth at bedtime as needed for sleep.   No facility-administered encounter medications on file as of 06/29/2018.     Review of Systems  Constitutional: Negative for appetite change and unexpected weight change.  HENT: Negative for congestion and sinus pressure.   Eyes: Negative for pain and visual disturbance.  Respiratory: Negative for cough, chest tightness and shortness of breath.   Cardiovascular: Positive for chest pain. Negative for palpitations and leg swelling.  Gastrointestinal: Negative for abdominal pain, diarrhea, nausea and vomiting.  Genitourinary: Negative for difficulty urinating and dysuria.  Musculoskeletal: Negative for joint swelling and myalgias.  Skin: Negative for color change and rash.  Neurological: Negative for dizziness, light-headedness and headaches.  Hematological: Negative for adenopathy. Does not bruise/bleed easily.  Psychiatric/Behavioral: Negative for agitation and dysphoric mood.       Objective:     Blood pressure rechecked by me:  104/68  Physical Exam  Constitutional: She is oriented to person, place, and time. She appears well-developed and well-nourished. No distress.  HENT:  Nose: Nose normal.  Mouth/Throat: Oropharynx is clear and moist.  Eyes: Right eye exhibits no discharge. Left eye exhibits no discharge. No scleral icterus.  Neck: Neck supple. No thyromegaly present.  Cardiovascular: Normal rate and regular rhythm.  Pulmonary/Chest: Breath sounds normal. No accessory muscle usage. No tachypnea. No respiratory distress. She has no decreased breath sounds. She has no wheezes. She has no rhonchi. Right breast exhibits no inverted nipple, no mass, no nipple discharge and no tenderness (no axillary adenopathy). Left breast exhibits no inverted nipple, no mass, no nipple discharge and no tenderness (no axilarry adenopathy).  Abdominal: Soft. Bowel sounds are normal. There is no tenderness.   Musculoskeletal: She exhibits no edema or tenderness.  Lymphadenopathy:    She has no cervical adenopathy.  Neurological: She is alert and oriented to person, place, and time.  Skin: No rash noted. No erythema.  Psychiatric: She has a normal mood and affect. Her behavior is normal.    BP (!) 96/56   Pulse (!) 52   Temp 97.7 F (36.5 C) (Oral)   Ht 5\' 9"  (1.753 m)   Wt 168 lb 3.2 oz (76.3 kg)   SpO2 95%   BMI 24.84 kg/m  Wt Readings from Last 3 Encounters:  06/29/18 168 lb 3.2 oz (76.3 kg)  04/27/18 170 lb (77.1 kg)  08/15/17 168 lb (76.2 kg)     Lab Results  Component Value Date   WBC 4.2 06/27/2017  HGB 13.7 06/27/2017   HCT 42.4 06/27/2017   PLT 213 06/27/2017   GLUCOSE 92 06/27/2017   CHOL 155 06/27/2017   TRIG 64 06/27/2017   HDL 41 06/27/2017   LDLCALC 101 (H) 06/27/2017   ALT 10 06/27/2017   AST 14 06/27/2017   NA 141 06/27/2017   K 4.7 06/27/2017   CL 103 06/27/2017   CREATININE 1.03 (H) 06/27/2017   BUN 14 06/27/2017   CO2 24 06/27/2017   TSH 1.700 06/27/2017    Mm Screening Breast Tomo Bilateral  Result Date: 09/15/2017 CLINICAL DATA:  Screening. EXAM: 2D DIGITAL SCREENING BILATERAL MAMMOGRAM WITH CAD AND ADJUNCT TOMO COMPARISON:  Previous exam(s). ACR Breast Density Category b: There are scattered areas of fibroglandular density. FINDINGS: There are no findings suspicious for malignancy. Images were processed with CAD. IMPRESSION: No mammographic evidence of malignancy. A result letter of this screening mammogram will be mailed directly to the patient. RECOMMENDATION: Screening mammogram in one year. (Code:SM-B-01Y) BI-RADS CATEGORY  1: Negative. Electronically Signed   By: Sande Brothers M.D.   On: 09/15/2017 12:04       Assessment & Plan:   Problem List Items Addressed This Visit    Abnormal Pap smear of cervix    S/p LEEP.  Followed at [facility].  Last evaluated 07/2017      Chest pain    Chest pain as outlined.  EKG - SR with no acute  ischemic changes.  Given persistent intermittent episodes will have cardiology evaluate for question of need for further w/up.        Relevant Orders   EKG 12-Lead (Completed)   Ambulatory referral to Cardiology   CBC with Differential/Platelet   Comprehensive metabolic panel   Lipid panel   Health care maintenance    Physical today 06/29/18.  Mammogram 09/15/17 - Birads I.  Due colonoscopy 2022.        Hypothyroidism    On thyroid replacement.  Follow tsh.        Relevant Orders   TSH   Stress    Increased stress and anxiety as outlined.  Discussed treatment options.  Will start zoloft.  Follow closely.  Get her back in soon to reassess.       Other Visit Diagnoses    Routine general medical examination at a health care facility    -  Primary       Dale Clyde, MD

## 2018-06-29 NOTE — Assessment & Plan Note (Signed)
On thyroid replacement.  Follow tsh.  

## 2018-06-29 NOTE — Assessment & Plan Note (Signed)
Increased stress and anxiety as outlined.  Discussed treatment options.  Will start zoloft.  Follow closely.  Get her back in soon to reassess.

## 2018-06-29 NOTE — Progress Notes (Signed)
Pre visit review using our clinic review tool, if applicable. No additional management support is needed unless otherwise documented below in the visit note. 

## 2018-06-29 NOTE — Assessment & Plan Note (Signed)
Physical today 06/29/18.  Mammogram 09/15/17 - Birads I.  Due colonoscopy 2022.

## 2018-06-30 DIAGNOSIS — R079 Chest pain, unspecified: Secondary | ICD-10-CM | POA: Diagnosis not present

## 2018-06-30 DIAGNOSIS — E039 Hypothyroidism, unspecified: Secondary | ICD-10-CM | POA: Diagnosis not present

## 2018-07-01 ENCOUNTER — Other Ambulatory Visit: Payer: Self-pay | Admitting: Internal Medicine

## 2018-07-01 DIAGNOSIS — E039 Hypothyroidism, unspecified: Secondary | ICD-10-CM

## 2018-07-01 DIAGNOSIS — R944 Abnormal results of kidney function studies: Secondary | ICD-10-CM

## 2018-07-01 LAB — CBC WITH DIFFERENTIAL/PLATELET
Basophils Absolute: 0 10*3/uL (ref 0.0–0.2)
Basos: 1 %
EOS (ABSOLUTE): 0.2 10*3/uL (ref 0.0–0.4)
Eos: 6 %
HEMOGLOBIN: 13.6 g/dL (ref 11.1–15.9)
Hematocrit: 42 % (ref 34.0–46.6)
IMMATURE GRANS (ABS): 0 10*3/uL (ref 0.0–0.1)
IMMATURE GRANULOCYTES: 0 %
LYMPHS: 34 %
Lymphocytes Absolute: 1.4 10*3/uL (ref 0.7–3.1)
MCH: 29.1 pg (ref 26.6–33.0)
MCHC: 32.4 g/dL (ref 31.5–35.7)
MCV: 90 fL (ref 79–97)
MONOCYTES: 11 %
Monocytes Absolute: 0.5 10*3/uL (ref 0.1–0.9)
NEUTROS ABS: 2 10*3/uL (ref 1.4–7.0)
NEUTROS PCT: 48 %
PLATELETS: 221 10*3/uL (ref 150–450)
RBC: 4.67 x10E6/uL (ref 3.77–5.28)
RDW: 12.9 % (ref 12.3–15.4)
WBC: 4.1 10*3/uL (ref 3.4–10.8)

## 2018-07-01 LAB — COMPREHENSIVE METABOLIC PANEL
ALK PHOS: 45 IU/L (ref 39–117)
ALT: 10 IU/L (ref 0–32)
AST: 14 IU/L (ref 0–40)
Albumin/Globulin Ratio: 1.6 (ref 1.2–2.2)
Albumin: 4.4 g/dL (ref 3.6–4.8)
BUN/Creatinine Ratio: 12 (ref 12–28)
BUN: 13 mg/dL (ref 8–27)
Bilirubin Total: 0.5 mg/dL (ref 0.0–1.2)
CALCIUM: 9.5 mg/dL (ref 8.7–10.3)
CO2: 24 mmol/L (ref 20–29)
CREATININE: 1.05 mg/dL — AB (ref 0.57–1.00)
Chloride: 104 mmol/L (ref 96–106)
GFR calc Af Amer: 67 mL/min/{1.73_m2} (ref >59)
GFR, EST NON AFRICAN AMERICAN: 58 mL/min/{1.73_m2} — AB (ref >59)
GLUCOSE: 88 mg/dL (ref 65–99)
Globulin, Total: 2.8 g/dL (ref 1.5–4.5)
Potassium: 4.7 mmol/L (ref 3.5–5.2)
SODIUM: 143 mmol/L (ref 134–144)
Total Protein: 7.2 g/dL (ref 6.0–8.5)

## 2018-07-01 LAB — LIPID PANEL
Chol/HDL Ratio: 3.6 ratio (ref 0.0–4.4)
Cholesterol, Total: 156 mg/dL (ref 100–199)
HDL: 43 mg/dL (ref >39)
LDL Calculated: 97 mg/dL (ref 0–99)
Triglycerides: 79 mg/dL (ref 0–149)
VLDL CHOLESTEROL CAL: 16 mg/dL (ref 5–40)

## 2018-07-01 LAB — TSH: TSH: 13.56 u[IU]/mL — AB (ref 0.450–4.500)

## 2018-07-01 NOTE — Progress Notes (Signed)
Order placed for f/u labs.  

## 2018-07-03 ENCOUNTER — Other Ambulatory Visit: Payer: Self-pay

## 2018-07-03 MED ORDER — LEVOTHYROXINE SODIUM 125 MCG PO TABS: ORAL_TABLET | ORAL | 0 refills | Status: DC

## 2018-08-10 ENCOUNTER — Ambulatory Visit: Payer: 59 | Admitting: Internal Medicine

## 2018-08-18 ENCOUNTER — Other Ambulatory Visit: Payer: Self-pay | Admitting: Internal Medicine

## 2018-08-18 DIAGNOSIS — Z1231 Encounter for screening mammogram for malignant neoplasm of breast: Secondary | ICD-10-CM

## 2018-09-21 NOTE — Progress Notes (Deleted)
Gynecology Annual Exam  PCP: Dale Whittingham, MD  Chief Complaint:  No chief complaint on file.   History of Present Illness:Meagan Calhoun presents today for her annual exam. She is a 63 year old postmenopausal Caucasian/White female , G 1 P 0 0 1 0 . She is having no significant GYN problems.  She has had no spotting. Denies hot flashes  The patient's past medical history is notable for a history of Hashimoto's thyroiditis and hypothyroidism. Her current PCP is Dr Lorin Picket.. She also had persistent HRHPV on her Paps and had a LEEP in 2014 (path returned chronic cervicitis). Her Pap smears have been normal since then.  Since her last annual GYN exam dated 08/14/2016 , she has had no significant changes in her health history. She has had a nonproductive cough for 3 weeks. She feels that the cough is not as bad as it was. It is worse in the morning and at night when lying down. Denies heartburn/ reflux symptoms. Has been using cough drops and a honey/whiskey drink at night. Denies fever.  She is sexually active. She does not have vaginal dryness.   Her most recent pap smear was obtained 08/14/2016 and was RNIL.  Her most recent mammogram obtained on 09/11/2016 was normal. There is a positive history of breast cancer in her sister. Genetic testing has not been done. There is no family history of ovarian cancer. The patient does do monthly self breast exams.  She had a colonoscopy in 08/15/2016 that showed a non-cancerous polyp. Next one due in 5 years  She had a recent DEXA scan obtained in 2016 that was normal.  The patient does not smoke.  The patient does drink infrequently.  The patient does not use illegal drugs.  The patient exercises by walking and working in the yard.  The patient does get adequate calcium in her diet.  She had a recent cholesterol screen in 2018 that was normal.    The patient denies current symptoms of depression.    Review of Systems: Review of Systems    Constitutional: Negative for chills, fever and weight loss.  HENT: Negative for congestion, sinus pain and sore throat.   Eyes: Negative for blurred vision and pain.  Respiratory: Positive for cough. Negative for hemoptysis, shortness of breath and wheezing.   Cardiovascular: Negative for chest pain, palpitations and leg swelling.  Gastrointestinal: Negative for abdominal pain, blood in stool, diarrhea, heartburn, nausea and vomiting.  Genitourinary: Negative for dysuria, frequency, hematuria and urgency.  Musculoskeletal: Negative for back pain, joint pain and myalgias.  Skin: Negative for itching and rash.  Neurological: Negative for dizziness, tingling and headaches.  Endo/Heme/Allergies: Negative for environmental allergies and polydipsia. Does not bruise/bleed easily.       Negative for hirsutism   Psychiatric/Behavioral: Negative for depression. The patient is not nervous/anxious and does not have insomnia.     Past Medical History:  Past Medical History:  Diagnosis Date  . Abnormal Pap smear of cervix   . GERD (gastroesophageal reflux disease)   . H/O jaundice   . History of chicken pox   . History of esophageal stricture 2012  . Hx of colonic polyp   . Hypothyroidism     Past Surgical History:  Past Surgical History:  Procedure Laterality Date  . BREAST BIOPSY Left    neg  . CERVICAL BIOPSY  W/ LOOP ELECTRODE EXCISION  08/2013   chronic cervicitis  . CHOLECYSTECTOMY  11/2006   Dr  Renda RollsWilton Smith  . COLONOSCOPY  2012, 2017   normal  . ESOPHAGOGASTRODUODENOSCOPY (EGD) WITH PROPOFOL N/A 06/22/2015   Procedure: ESOPHAGOGASTRODUODENOSCOPY (EGD) WITH PROPOFOL;  Surgeon: Scot Junobert T Elliott, MD;  [facility] END[facility]COPY;  Service: Endoscopy;  Laterality: N/A;  . LAPAROTOMY  1988   ectopic pregnancy  . SAVORY DILATION N/A 06/22/2015   Procedure: SAVORY DILATION;  Surgeon: Scot Junobert T Elliott, MD;  [facility] END[facility]COPY;  Service: Endoscopy;  Laterality: N/A;  . thyroidectomy  Right 2013   partial right thyroidectomy  . TUBAL LIGATION    . WISDOM TOOTH EXTRACTION      Family History:  Family History  Problem Relation Age of Onset  . Hypothyroidism Mother   . Heart disease Father        CHF  . Breast cancer Sister 247  . Alzheimer's disease Sister   . Colon cancer Neg Hx     Social History:  Social History   Socioeconomic History  . Marital status: Married    Spouse name: Not on file  . Number of children: 0  . Years of education: 7712  . Highest education level: Not on file  Occupational History  . Not on file  Social Needs  . Financial resource strain: Not on file  . Food insecurity:    Worry: Not on file    Inability: Not on file  . Transportation needs:    Medical: Not on file    Non-medical: Not on file  Tobacco Use  . Smoking status: Never Smoker  . Smokeless tobacco: Never Used  Substance and Sexual Activity  . Alcohol use: Yes    Alcohol/week: 0.0 standard drinks  . Drug use: No  . Sexual activity: Yes    Partners: Male    Birth control/protection: Post-menopausal  Lifestyle  . Physical activity:    Days per week: 4 days    Minutes per session: 30 min  . Stress: Not at all  Relationships  . Social connections:    Talks on phone: More than three times a week    Gets together: Once a week    Attends religious service: More than 4 times per year    Active member of club or organization: No    Attends meetings of clubs or organizations: Never    Relationship status: Married  . Intimate partner violence:    Fear of current or ex partner: No    Emotionally abused: No    Physically abused: No    Forced sexual activity: No  Other Topics Concern  . Not on file  Social History Narrative  . Not on file    Allergies:  No Known Allergies  Medications: Prior to Admission medications   Medication Sig Start Date End Date Taking? Authorizing Provider  Biotin 10 MG CAPS Take by mouth.    [provider]    Cholecalciferol (VITAMIN D PO) Take by mouth.    [provider]  Cyanocobalamin (B-12 PO) Take by mouth.    [provider]  levothyroxine (SYNTHROID, LEVOTHROID) 100 MCG tablet TAKE 1 TABLET BY MOUTH ONCE DAILY WITH BREAKFAST 07/17/17   Dale DurhamScott, Charlene, MD    Physical Exam Vitals: There were no vitals taken for this visit.  General: WF in NAD, non productive cough occasionally HEENT: normocephalic, anicteric  OP: no exudates or inflammation  Nasal turbinates: inflamed Neck: no thyroid enlargement, no palpable nodules, no cervical lymphadenopathy  Pulmonary: No increased work of breathing, CTAB Cardiovascular: RRR, without murmur  Breast:  Breast symmetrical, no tenderness, no palpable nodules or masses, no skin or nipple retraction present, no nipple discharge.  No axillary, infraclavicular or supraclavicular lymphadenopathy. Abdomen: Soft, non-tender, non-distended.  Umbilicus without lesions.  No hepatomegaly or masses palpable. No evidence of hernia. Genitourinary:  External: Normal external female genitalia.  Normal urethral meatus, normal Bartholin's and Skene's glands.    Vagina: Normal vaginal mucosa, no evidence of prolapse.    Cervix: Grossly normal in appearance, no bleeding, non-tender  Uterus: Anteverted, small, mobile, and non-tender  Adnexa: No adnexal masses, non-tender  Rectal: deferred  Lymphatic: no evidence of inguinal lymphadenopathy Extremities: no edema, erythema, or tenderness Neurologic: Grossly intact Psychiatric: mood appropriate, affect full     Assessment: 63 y.o. annual gyn exam History of abnormal Pap smear with LEEP in 2014 Cough-probably due to URI  Plan:   1) Breast cancer screening - recommend monthly self breast exam and annual mammograms. PCP ordered mammogram and it is scheduled for later this month. LIfetime risk of breast cancer 14.9%.   2) Colon cancer screening UTD-next due 2022.  3) Cervical cancer screening - Pap  was done.   4) Osteoporosis prevention: discussed calcium and vitamin D3 requirements. Explained role of exercise in preventing osteoporosis  5) Routine healthcare maintenance including cholesterol and diabetes screening UTD   6) RTO 1 year and prn  Farrel Conners, CNM

## 2018-09-21 NOTE — Progress Notes (Signed)
Cardiology Office Note  Date:  09/22/2018   ID:  Meagan Calhoun, DOB 1956-09-10, MRN [id number]  PCP:  Dale Turner, MD   Chief Complaint  Patient presents with  . other    CP comes and goes, possible anxiety. Medications reviewed verbally.    HPI:  [name] is a 63 year old woman  Nonsmoker No diabetes Total chol 156 Who presents by referral from Dr. Lorin Picket for consultation of her symptoms of chest discomfort   Increased stress at home Stress from mother coming to the area,fall and fractures, lives at [facility]  Diagnosed with pneumonia.  Back in nursing facility.   brother-n-law passed away unexpectedly   sister-n-law has alzheimers  Denies any chest pain or shortness of breath with exertion Very active at baseline, maintaining her property  CT lung screen 2013 images pulled up and reviewed with her No coronary calcification No aortic athero  Occasional palpitations She feels this is secondary to stress Seems to happen more at nighttime when she is trying to go to sleep Feels better when she is more active  EKG personally reviewed by myself on todays visit Shows normal sinus rhythm rate 60 bpm no significant ST or T wave changes  PMH:   has a past medical history of Abnormal Pap smear of cervix, GERD (gastroesophageal reflux disease), H/O jaundice, History of chicken pox, History of esophageal stricture (2012), colonic polyp, and Hypothyroidism.  PSH:    Past Surgical History:  Procedure Laterality Date  . BREAST BIOPSY Left    neg  . CERVICAL BIOPSY  W/ LOOP ELECTRODE EXCISION  08/2013   chronic cervicitis  . CHOLECYSTECTOMY  11/2006   Dr Renda Rolls  . COLONOSCOPY  2012, 2017   normal  . ESOPHAGOGASTRODUODENOSCOPY (EGD) WITH PROPOFOL N/A 06/22/2015   Procedure: ESOPHAGOGASTRODUODENOSCOPY (EGD) WITH PROPOFOL;  Surgeon: Scot Jun, MD;  [facility] ENDOSCOPY;  Service: Endoscopy;  Laterality: N/A;  . LAPAROTOMY  1988   ectopic  pregnancy  . SAVORY DILATION N/A 06/22/2015   Procedure: SAVORY DILATION;  Surgeon: Scot Jun, MD;  Location: [facility] ENDOSCOPY;  Service: Endoscopy;  Laterality: N/A;  . thyroidectomy Right 2013   partial right thyroidectomy  . TUBAL LIGATION    . WISDOM TOOTH EXTRACTION      Current Outpatient Medications  Medication Sig Dispense Refill  . Biotin 10 MG CAPS Take by mouth.    . levothyroxine (SYNTHROID, LEVOTHROID) 125 MCG tablet TAKE 1 TABLET BY MOUTH ONCE DAILY WITH BREAKFAST. NEED APPT & LABS FOR ADDITIONAL REFILLS 90 tablet 0  . sertraline (ZOLOFT) 50 MG tablet Take 1/2 tablet q day for one week and then one tablet q day. (Patient not taking: Reported on 09/22/2018) 30 tablet 1   No current facility-administered medications for this visit.      Allergies:   Patient has no known allergies.   Social History:  The patient  reports that she has never smoked. She has never used smokeless tobacco. She reports current alcohol use. She reports that she does not use drugs.   Family History:   family history includes Alzheimer's disease in her sister; Breast cancer (age of onset: 10) in her sister; Heart disease in her father; Hypothyroidism in her mother.    Review of Systems: Review of Systems  Constitutional: Negative.   Respiratory: Negative.   Cardiovascular: Positive for palpitations.  Gastrointestinal: Negative.   Musculoskeletal: Negative.   Neurological: Negative.   Psychiatric/Behavioral: Negative.   All other systems reviewed and  are negative.    PHYSICAL EXAM: VS:  BP 122/72 (BP Location: Left Arm, Patient Position: Sitting, Cuff Size: Normal)   Pulse 60   Ht 5\' 9"  (1.753 m)   Wt 172 lb (78 kg)   BMI 25.40 kg/m  , BMI Body mass index is 25.4 kg/m. GEN: Well nourished, well developed, in no acute distress  HEENT: normal  Neck: no JVD, carotid bruits, or masses Cardiac: RRR; no murmurs, rubs, or gallops,no edema  Respiratory:  clear to auscultation  bilaterally, normal work of breathing GI: soft, nontender, nondistended, + BS MS: no deformity or atrophy  Skin: warm and dry, no rash Neuro:  Strength and sensation are intact Psych: euthymic mood, full affect    Recent Labs: 06/30/2018: ALT 10; BUN 13; Creatinine, Ser 1.05; Hemoglobin 13.6; Platelets 221; Potassium 4.7; Sodium 143; TSH 13.560    Lipid Panel Lab Results  Component Value Date   CHOL 156 06/30/2018   HDL 43 06/30/2018   LDLCALC 97 06/30/2018   TRIG 79 06/30/2018      Wt Readings from Last 3 Encounters:  09/22/18 172 lb (78 kg)  06/29/18 168 lb 3.2 oz (76.3 kg)  04/27/18 170 lb (77.1 kg)       ASSESSMENT AND PLAN:  Chest pain, unspecified type - Plan: EKG 12-Lead Atypical in nature Likely secondary to stress or anxiety CT coronary calcium score 0 on review of images from 2013 No aortic atherosclerosis Cholesterol is reasonable, nondiabetic, non-smoker No significant risk factors  Stress Adjustment disorder from managing mother's health, also other family members with medical issues  Palpitations Likely secondary to stress Does not want beta-blockers in the evening Prefers to work toward herself  Disposition:   F/U as needed   Total encounter time more than 60 minutes  Greater than 50% was spent in counseling and coordination of care with the patient  Patient was seen for Dr. Lorin Picket and will be referred back to her office for ongoing care of the issues detailed above   Orders Placed This Encounter  Procedures  . EKG 12-Lead     Signed, Dossie Arbour, M.D., Ph.D. 09/22/2018  [facility] [place], [place] [phone]

## 2018-09-22 ENCOUNTER — Ambulatory Visit: Payer: 59 | Admitting: Cardiovascular Disease

## 2018-09-22 ENCOUNTER — Encounter: Payer: Self-pay | Admitting: Cardiovascular Disease

## 2018-09-22 VITALS — BP 122/72 | HR 60 | Ht 69.0 in | Wt 172.0 lb

## 2018-09-22 DIAGNOSIS — R002 Palpitations: Secondary | ICD-10-CM | POA: Diagnosis not present

## 2018-09-22 DIAGNOSIS — F439 Reaction to severe stress, unspecified: Secondary | ICD-10-CM

## 2018-09-22 DIAGNOSIS — R079 Chest pain, unspecified: Secondary | ICD-10-CM

## 2018-09-22 NOTE — Patient Instructions (Signed)

## 2018-09-23 ENCOUNTER — Ambulatory Visit: Payer: 59 | Admitting: Certified Nurse Midwife

## 2018-09-23 ENCOUNTER — Ambulatory Visit (INDEPENDENT_AMBULATORY_CARE_PROVIDER_SITE_OTHER): Payer: 59 | Admitting: Certified Nurse Midwife

## 2018-09-23 ENCOUNTER — Encounter: Payer: Self-pay | Admitting: Certified Nurse Midwife

## 2018-09-23 VITALS — BP 120/70 | HR 61 | Ht 69.5 in | Wt 170.0 lb

## 2018-09-23 DIAGNOSIS — Z124 Encounter for screening for malignant neoplasm of cervix: Secondary | ICD-10-CM

## 2018-09-23 DIAGNOSIS — Z01419 Encounter for gynecological examination (general) (routine) without abnormal findings: Secondary | ICD-10-CM | POA: Diagnosis not present

## 2018-09-23 DIAGNOSIS — F418 Other specified anxiety disorders: Secondary | ICD-10-CM

## 2018-09-23 NOTE — Progress Notes (Addendum)
Gynecology Annual Exam  PCP: Dale [place], MD  Chief Complaint:  Chief Complaint  Patient presents with  . Gynecologic Exam    Anxiety     History of Present Illness:Meagan Calhoun presents today for her annual exam. She is a 63 year old postmenopausal Caucasian/White female , G 1 P 0 0 1 0 . She is having no significant GYN problems.  She has had no spotting. Denies hot flashes  The patient's past medical history is notable for a history of Hashimoto's thyroiditis and hypothyroidism. Her dose of levothyroxine was increased to daily because her TSH was slightly elevated.  Her current PCP is Dr Lorin Picket.. She also had persistent HRHPV on her Paps and had a LEEP in 2014 (path returned chronic cervicitis). Her Pap smears have been normal since then.  Since her last annual GYN exam dated 08/15/17 , she has been very stressed regarding her mother and sister's dementia. Her mother is 9 and had been living independently until recently. She has had pneumonia and has developed dementia. She also fell recently and fractured her shoulder. She is at [facility] and will be starting rehab. Her sister has Alzhiemers and her husband died suddenly. He was her caretaker and she is now in a assisted living facility for Alzheimer's patients in [place]. Was started on Zoloft by her PCP, but stopped taking because she does not like to be on medicine. She has taken Trazadone for sleep, but she does not think it has helped her sleep. She also had a cardiology consult recently for chest pain, but she said she got a clean bill of health from the cardiologist.  She is sexually active. She does not have vaginal dryness.   Her most recent pap smear was obtained 08/15/17 and was NIL/ negative HRHPV.  Her most recent mammogram obtained on 12/ 31/18 was normal. There is a positive history of breast cancer in her sister. Genetic testing has not been done. There is no family history of ovarian  cancer. The patient does do monthly self breast exams. Lifetime risk of breast cancer is 14.9%.  She had a colonoscopy in 08/15/2016 that showed a non-cancerous polyp. Next one due in 5 years  She had a recent DEXA scan obtained in 2016 that was normal.  The patient does not smoke.  The patient does drink infrequently.  The patient does not use illegal drugs.  The patient exercises by walking and working in the yard.  The patient does get adequate calcium in her diet.  She had a recent cholesterol screen in 2019 that was normal.    The patient denies current symptoms of depression.    Review of Systems: Review of Systems  Constitutional: Negative for chills, fever and weight loss.  HENT: Negative for congestion, sinus pain and sore throat.   Eyes: Negative for blurred vision and pain.  Respiratory: Negative for cough, hemoptysis, shortness of breath and wheezing.   Cardiovascular: Negative for chest pain, palpitations and leg swelling.  Gastrointestinal: Negative for abdominal pain, blood in stool, diarrhea, heartburn, nausea and vomiting.  Genitourinary: Negative for dysuria, frequency, hematuria and urgency.  Musculoskeletal: Negative for back pain, joint pain and myalgias.  Skin: Negative for itching and rash.  Neurological: Negative for dizziness, tingling and headaches.  Endo/Heme/Allergies: Negative for environmental allergies and polydipsia. Does not bruise/bleed easily.       Negative for hirsutism   Psychiatric/Behavioral: Negative for depression. The patient is nervous/anxious and has insomnia.  Past Medical History:  Past Medical History:  Diagnosis Date  . Abnormal Pap smear of cervix   . Anxiety   . GERD (gastroesophageal reflux disease)   . H/O jaundice   . History of chicken pox   . History of esophageal stricture 2012  . Hx of colonic polyp   . Hypothyroidism     Past Surgical History:  Past Surgical History:  Procedure Laterality Date  . BREAST BIOPSY  Left    neg  . CERVICAL BIOPSY  W/ LOOP ELECTRODE EXCISION  08/2013   chronic cervicitis  . CHOLECYSTECTOMY  11/2006   Dr Renda RollsWilton Smith  . COLONOSCOPY  2012, 2017   normal  . ESOPHAGOGASTRODUODENOSCOPY (EGD) WITH PROPOFOL N/A 06/22/2015   Procedure: ESOPHAGOGASTRODUODENOSCOPY (EGD) WITH PROPOFOL;  Surgeon: Scot Junobert T Elliott, MD;  [facility] END[facility]COPY;  Service: Endoscopy;  Laterality: N/A;  . LAPAROTOMY  1988   ectopic pregnancy  . SAVORY DILATION N/A 06/22/2015   Procedure: SAVORY DILATION;  Surgeon: Scot Junobert T Elliott, MD;  [facility] END[facility]COPY;  Service: Endoscopy;  Laterality: N/A;  . thyroidectomy Right 2013   partial right thyroidectomy  . TUBAL LIGATION    . WISDOM TOOTH EXTRACTION      Family History:  Family History  Problem Relation Age of Onset  . Hypothyroidism Mother   . Dementia Mother   . Heart disease Father        CHF  . Breast cancer Sister [id number]  . Alzheimer's disease Sister   . Heart attack Brother 5669  . Colon cancer Neg Hx     Social History:  Social History   Socioeconomic History  . Marital status: Married    Spouse name: Not on file  . Number of children: 0  . Years of education: 6412  . Highest education level: Not on file  Occupational History  . Not on file  Social Needs  . Financial resource strain: Not on file  . Food insecurity:    Worry: Not on file    Inability: Not on file  . Transportation needs:    Medical: Not on file    Non-medical: Not on file  Tobacco Use  . Smoking status: Never Smoker  . Smokeless tobacco: Never Used  Substance and Sexual Activity  . Alcohol use: Yes    Alcohol/week: 0.0 standard drinks  . Drug use: No  . Sexual activity: Yes    Partners: Male    Birth control/protection: Post-menopausal  Lifestyle  . Physical activity:    Days per week: 4 days    Minutes per session: 30 min  . Stress: Not at all  Relationships  . Social connections:    Talks on phone: More than three times a week    Gets  together: Once a week    Attends religious service: More than 4 times per year    Active member of club or organization: No    Attends meetings of clubs or organizations: Never    Relationship status: Married  . Intimate partner violence:    Fear of current or ex partner: No    Emotionally abused: No    Physically abused: No    Forced sexual activity: No  Other Topics Concern  . Not on file  Social History Narrative  . Not on file    Allergies:  No Known Allergies  Medications:  Current Outpatient Medications on File Prior to Visit  Medication Sig Dispense Refill  . Biotin 10 MG CAPS Take by mouth.    .Marland Kitchen  levothyroxine (SYNTHROID, LEVOTHROID) 125 MCG tablet TAKE 1 TABLET BY MOUTH ONCE DAILY WITH BREAKFAST. NEED APPT & LABS FOR ADDITIONAL REFILLS 90 tablet 0  . traZODone (DESYREL) 50 MG tablet Take 50 mg by mouth at bedtime.    . sertraline (ZOLOFT) 50 MG tablet Take 1/2 tablet q day for one week and then one tablet q day. (Patient not taking: Reported on 09/22/2018) 30 tablet 1   No current facility-administered medications on file prior to visit.    Physical Exam Vitals: BP 120/70   Pulse 61   Ht 5' 9.5" (1.765 m)   Wt 170 lb (77.1 kg)   BMI 24.74 kg/m   General: WF in NAD HEENT: normocephalic, anicteric Neck: left thyroid lobe larger than right, no palpable nodules, no cervical lymphadenopathy  Pulmonary: No increased work of breathing, CTAB Cardiovascular: RRR, without murmur  Breast: Breast symmetrical, no tenderness, no palpable nodules or masses, no skin or nipple retraction present, no nipple discharge.  No axillary, infraclavicular or supraclavicular lymphadenopathy. Abdomen: Soft, non-tender, non-distended.  Umbilicus without lesions.  No hepatomegaly or masses palpable. No evidence of hernia. Genitourinary:  External: Normal external female genitalia.  Normal urethral meatus, normal Bartholin's and Skene's glands.    Vagina: Normal vaginal mucosa, no evidence of  prolapse.    Cervix: Grossly normal in appearance, no bleeding, non-tender  Uterus: Anteverted, small, mobile, and non-tender  Adnexa: No adnexal masses, non-tender  Rectal: deferred  Lymphatic: no evidence of inguinal lymphadenopathy Extremities: no edema, erythema, or tenderness Neurologic: Grossly intact Psychiatric: mood appropriate, affect full     Assessment: 63 y.o. annual gyn exam History of abnormal Pap smear with LEEP in 2014 Situational anxiety R/T family health stressors (mother and sister with dementia)  Plan:   1) Breast cancer screening - recommend monthly self breast exam and annual mammograms. PCP ordered mammogram and it is scheduled for tomorrow. LIfetime risk of breast cancer 14.9%.   2) Colon cancer screening UTD-next due 2022.  3) Cervical cancer screening - Pap was done.   4) Anxiety: taking it one day at a time. Prayer, and trying to get some time away with husband, who has been very supportive. To follow up with PCP as scheduled.  5) Routine healthcare maintenance including cholesterol and diabetes screening UTD   6) RTO 1 year and prn  Farrel Connersolleen Quinne Pires, CNM

## 2018-09-24 ENCOUNTER — Ambulatory Visit
Admission: RE | Admit: 2018-09-24 | Discharge: 2018-09-24 | Disposition: A | Discharge status: Home / Self Care | Payer: 59 | Source: Ambulatory Visit | Attending: Internal Medicine | Admitting: Internal Medicine

## 2018-09-24 DIAGNOSIS — Z1231 Encounter for screening mammogram for malignant neoplasm of breast: Secondary | ICD-10-CM | POA: Diagnosis present

## 2018-09-25 LAB — IGP,RFX APTIMA HPV ALL PTH

## 2018-09-29 ENCOUNTER — Other Ambulatory Visit: Payer: Self-pay | Admitting: Internal Medicine

## 2018-11-13 ENCOUNTER — Encounter: Payer: Self-pay | Admitting: Internal Medicine

## 2018-11-13 ENCOUNTER — Ambulatory Visit: Payer: 59 | Admitting: Internal Medicine

## 2018-11-13 VITALS — BP 92/62 | HR 61 | Temp 98.2°F | Resp 16 | Wt 173.2 lb

## 2018-11-13 DIAGNOSIS — F439 Reaction to severe stress, unspecified: Secondary | ICD-10-CM

## 2018-11-13 DIAGNOSIS — Z803 Family history of malignant neoplasm of breast: Secondary | ICD-10-CM

## 2018-11-13 DIAGNOSIS — E039 Hypothyroidism, unspecified: Secondary | ICD-10-CM | POA: Diagnosis not present

## 2018-11-13 DIAGNOSIS — R87619 Unspecified abnormal cytological findings in specimens from cervix uteri: Secondary | ICD-10-CM

## 2018-11-13 DIAGNOSIS — Z860100 Personal history of colon polyps, unspecified: Secondary | ICD-10-CM

## 2018-11-13 MED ORDER — HYDROCORTISONE ACETATE 25 MG RE SUPP: 25.0000 mg | Freq: Two times a day (BID) | RECTAL | 0 refills | Status: DC

## 2018-11-13 MED ORDER — SERTRALINE HCL 50 MG PO TABS: ORAL_TABLET | ORAL | 1 refills | Status: DC

## 2018-11-13 NOTE — Progress Notes (Signed)
Patient ID: Meagan Calhoun, female   DOB: 10-31-1955, 63 y.o.   MRN: [id number]   Subjective:    Patient ID: Meagan Calhoun, female    DOB: 05-04-1956, 63 y.o.   MRN: [id number]  HPI  Patient here for a scheduled follow up. She reports she is doing better.  Still with increased stress with family health issues, but feels she is handling things better.  She did try zoloft, but only took  dose and only took as needed.  Only takes trazodone as needed.  Discussed increasing dose of zoloft and discussed taking on a regular basis.  Saw Dr Lady Gary for chest discomfort, etc.  Everything checked out fine.  No further chest pain.  No sob.  No acid reflux.  No abdominal pain.  Bowels moving.  Sometimes will have issues with straining.  Has noticed some blood with bowel movements (both with wiping and in the stool).  Saw gyn 09/23/18.  Recommended f/u in one year.     Past Medical History:  Diagnosis Date  . Abnormal Pap smear of cervix   . Anxiety   . GERD (gastroesophageal reflux disease)   . H/O jaundice   . History of chicken pox   . History of esophageal stricture 2012  . Hx of colonic polyp   . Hypothyroidism    Past Surgical History:  Procedure Laterality Date  . BREAST BIOPSY Left    neg  . CERVICAL BIOPSY  W/ LOOP ELECTRODE EXCISION  08/2013   chronic cervicitis  . CHOLECYSTECTOMY  11/2006   Dr Renda Rolls  . COLONOSCOPY  2012, 2017   normal  . ESOPHAGOGASTRODUODENOSCOPY (EGD) WITH PROPOFOL N/A 06/22/2015   Procedure: ESOPHAGOGASTRODUODENOSCOPY (EGD) WITH PROPOFOL;  Surgeon: Scot Jun, MD;  Location: [facility] ENDOSCOPY;  Service: Endoscopy;  Laterality: N/A;  . LAPAROTOMY  1988   ectopic pregnancy  . SAVORY DILATION N/A 06/22/2015   Procedure: SAVORY DILATION;  Surgeon: Scot Jun, MD;  Location: [facility] ENDOSCOPY;  Service: Endoscopy;  Laterality: N/A;  . thyroidectomy Right 2013   partial right thyroidectomy  . TUBAL LIGATION    . WISDOM TOOTH EXTRACTION     Family  History  Problem Relation Age of Onset  . Hypothyroidism Mother   . Dementia Mother   . Heart disease Father        CHF  . Breast cancer Sister 4  . Alzheimer's disease Sister   . Heart attack Brother 18  . Colon cancer Neg Hx    Social History   Socioeconomic History  . Marital status: Married    Spouse name: Not on file  . Number of children: 0  . Years of education: 39  . Highest education level: Not on file  Occupational History  . Not on file  Social Needs  . Financial resource strain: Not on file  . Food insecurity:    Worry: Not on file    Inability: Not on file  . Transportation needs:    Medical: Not on file    Non-medical: Not on file  Tobacco Use  . Smoking status: Never Smoker  . Smokeless tobacco: Never Used  Substance and Sexual Activity  . Alcohol use: Yes    Alcohol/week: 0.0 standard drinks  . Drug use: No  . Sexual activity: Yes    Partners: Male    Birth control/protection: Post-menopausal  Lifestyle  . Physical activity:    Days per week: 4 days    Minutes per session: 30 min  .  Stress: Not at all  Relationships  . Social connections:    Talks on phone: More than three times a week    Gets together: Once a week    Attends religious service: More than 4 times per year    Active member of club or organization: No    Attends meetings of clubs or organizations: Never    Relationship status: Married  Other Topics Concern  . Not on file  Social History Narrative  . Not on file    Outpatient Encounter Medications as of 11/13/2018  Medication Sig  . Biotin 10 MG CAPS Take by mouth.  . levothyroxine (SYNTHROID, LEVOTHROID) 125 MCG tablet TAKE 1 TABLET BY MOUTH EVERY MORNING WITH BREAKFAST. NEED APPT AND LABS FOR ADDITIONAL REFILLS  . sertraline (ZOLOFT) 50 MG tablet one tablet q day.  . traZODone (DESYREL) 50 MG tablet Take 50 mg by mouth at bedtime.  . [DISCONTINUED] sertraline (ZOLOFT) 50 MG tablet Take 1/2 tablet q day for one week and  then one tablet q day.  . hydrocortisone (ANUSOL-HC) 25 MG suppository Place 1 suppository (25 mg total) rectally 2 (two) times daily.   No facility-administered encounter medications on file as of 11/13/2018.     Review of Systems  Constitutional: Negative for appetite change and unexpected weight change.  HENT: Negative for congestion and sinus pressure.   Respiratory: Negative for cough, chest tightness and shortness of breath.   Cardiovascular: Negative for chest pain, palpitations and leg swelling.  Gastrointestinal: Positive for blood in stool. Negative for abdominal pain, diarrhea, nausea and vomiting.  Genitourinary: Negative for difficulty urinating and dysuria.  Musculoskeletal: Negative for joint swelling and myalgias.  Skin: Negative for color change and rash.  Neurological: Negative for dizziness, light-headedness and headaches.  Psychiatric/Behavioral: Negative for agitation and dysphoric mood.       Increased stress as outlined.         Objective:    Physical Exam Constitutional:      General: She is not in acute distress.    Appearance: Normal appearance.  HENT:     Nose: Nose normal. No congestion.     Mouth/Throat:     Pharynx: No oropharyngeal exudate or posterior oropharyngeal erythema.  Neck:     Musculoskeletal: Neck supple. No muscular tenderness.     Thyroid: No thyromegaly.  Cardiovascular:     Rate and Rhythm: Normal rate and regular rhythm.  Pulmonary:     Effort: No respiratory distress.     Breath sounds: Normal breath sounds. No wheezing.  Abdominal:     General: Bowel sounds are normal.     Palpations: Abdomen is soft.     Tenderness: There is no abdominal tenderness.  Genitourinary:    Comments: Rectal - no external or internal hemorrhoid appreciated.  Question of fissure.  Heme positive.  Musculoskeletal:        General: No swelling or tenderness.  Lymphadenopathy:     Cervical: No cervical adenopathy.  Skin:    Findings: No erythema  or rash.  Neurological:     Mental Status: She is alert.  Psychiatric:        Mood and Affect: Mood normal.        Behavior: Behavior normal.     BP 92/62   Pulse 61   Temp 98.2 F (36.8 C) (Oral)   Resp 16   Wt 173 lb 3.2 oz (78.6 kg)   SpO2 98%   BMI 25.21 kg/m  Wt Readings from  Last 3 Encounters:  11/13/18 173 lb 3.2 oz (78.6 kg)  09/23/18 170 lb (77.1 kg)  09/22/18 172 lb (78 kg)     Lab Results  Component Value Date   WBC 4.1 06/30/2018   HGB 13.6 06/30/2018   HCT 42.0 06/30/2018   PLT 221 06/30/2018   GLUCOSE 90 11/13/2018   CHOL 156 06/30/2018   TRIG 79 06/30/2018   HDL 43 06/30/2018   LDLCALC 97 06/30/2018   ALT 10 06/30/2018   AST 14 06/30/2018   NA 140 11/13/2018   K 4.3 11/13/2018   CL 102 11/13/2018   CREATININE 0.91 11/13/2018   BUN 14 11/13/2018   CO2 24 11/13/2018   TSH 0.266 (L) 11/13/2018    Mm 3d Screen Breast Bilateral  Result Date: 09/24/2018 CLINICAL DATA:  Screening. EXAM: DIGITAL SCREENING BILATERAL MAMMOGRAM WITH TOMO AND CAD COMPARISON:  Previous exam(s). ACR Breast Density Category b: There are scattered areas of fibroglandular density. FINDINGS: There are no findings suspicious for malignancy. Images were processed with CAD. IMPRESSION: No mammographic evidence of malignancy. A result letter of this screening mammogram will be mailed directly to the patient. RECOMMENDATION: Screening mammogram in one year. (Code:SM-B-01Y) BI-RADS CATEGORY  1: Negative. Electronically Signed   By: Sande Brothers M.D.   On: 09/24/2018 16:53       Assessment & Plan:   Problem List Items Addressed This Visit    Abnormal Pap smear of cervix    S/p LEEP.  Followed by gyn.  Just evaluated 09/23/18.  Recommended f/u in one year.        Family history of breast cancer    Mammogram 09/24/18 - birads I.       History of colonic polyps    Has history of colon polyps.  With rectal bleeding recently.  Exam as outlined.  Question of fissure.  anusol HC  suppositories as directed. Refer to GI for further evaluation given history and current symptoms.        Hypothyroidism - Primary    On thyroid replacement.  Follow tsh.        Relevant Orders   TSH (Completed)   Basic metabolic panel (Completed)   Stress    Increased stress as outlined.  Increased zoloft to 50mg  q day.  Take on a regular basis. Hold trazodone for now.  Follow.  She is doing better.  Has good support.            Dale [place], MD

## 2018-11-14 ENCOUNTER — Other Ambulatory Visit: Payer: Self-pay | Admitting: Internal Medicine

## 2018-11-14 DIAGNOSIS — E039 Hypothyroidism, unspecified: Secondary | ICD-10-CM

## 2018-11-14 LAB — BASIC METABOLIC PANEL
BUN/Creatinine Ratio: 15 (ref 12–28)
BUN: 14 mg/dL (ref 8–27)
CO2: 24 mmol/L (ref 20–29)
Calcium: 9.9 mg/dL (ref 8.7–10.3)
Chloride: 102 mmol/L (ref 96–106)
Creatinine, Ser: 0.91 mg/dL (ref 0.57–1.00)
GFR calc Af Amer: 79 mL/min/{1.73_m2} (ref >59)
GFR calc non Af Amer: 68 mL/min/{1.73_m2} (ref >59)
Glucose: 90 mg/dL (ref 65–99)
Potassium: 4.3 mmol/L (ref 3.5–5.2)
Sodium: 140 mmol/L (ref 134–144)

## 2018-11-14 LAB — TSH: TSH: 0.266 u[IU]/mL — ABNORMAL LOW (ref 0.450–4.500)

## 2018-11-14 NOTE — Progress Notes (Signed)
Order placed for f/u tsh.  

## 2018-11-15 ENCOUNTER — Encounter: Payer: Self-pay | Admitting: Internal Medicine

## 2018-11-15 NOTE — Assessment & Plan Note (Signed)
Increased stress as outlined.  Increased zoloft to 50mg  q day.  Take on a regular basis. Hold trazodone for now.  Follow.  She is doing better.  Has good support.

## 2018-11-15 NOTE — Assessment & Plan Note (Signed)
On thyroid replacement.  Follow tsh.  

## 2018-11-15 NOTE — Assessment & Plan Note (Signed)
S/p LEEP.  Followed by gyn.  Just evaluated 09/23/18.  Recommended f/u in one year.

## 2018-11-15 NOTE — Assessment & Plan Note (Signed)
Has history of colon polyps.  With rectal bleeding recently.  Exam as outlined.  Question of fissure.  anusol HC suppositories as directed. Refer to GI for further evaluation given history and current symptoms.

## 2018-11-15 NOTE — Assessment & Plan Note (Signed)
Mammogram 09/24/18 - birads I.

## 2018-11-20 ENCOUNTER — Other Ambulatory Visit: Payer: Self-pay

## 2018-11-20 ENCOUNTER — Telehealth: Payer: Self-pay | Admitting: Internal Medicine

## 2018-11-20 DIAGNOSIS — E039 Hypothyroidism, unspecified: Secondary | ICD-10-CM

## 2018-11-20 MED ORDER — LEVOTHYROXINE SODIUM 112 MCG PO TABS: ORAL_TABLET | ORAL | 1 refills | Status: DC

## 2018-11-20 NOTE — Telephone Encounter (Signed)
Order placed for f/u tsh.  

## 2018-12-11 IMAGING — MG MM DIGITAL SCREENING BILAT W/ TOMO W/ CAD
9 of 12 series · 9 of 28 positions shown · non-contrast
Comparison: Previous exam(s).

CLINICAL DATA: Screening.

EXAM:
2D DIGITAL SCREENING BILATERAL MAMMOGRAM WITH CAD AND ADJUNCT TOMO

[R MLO]
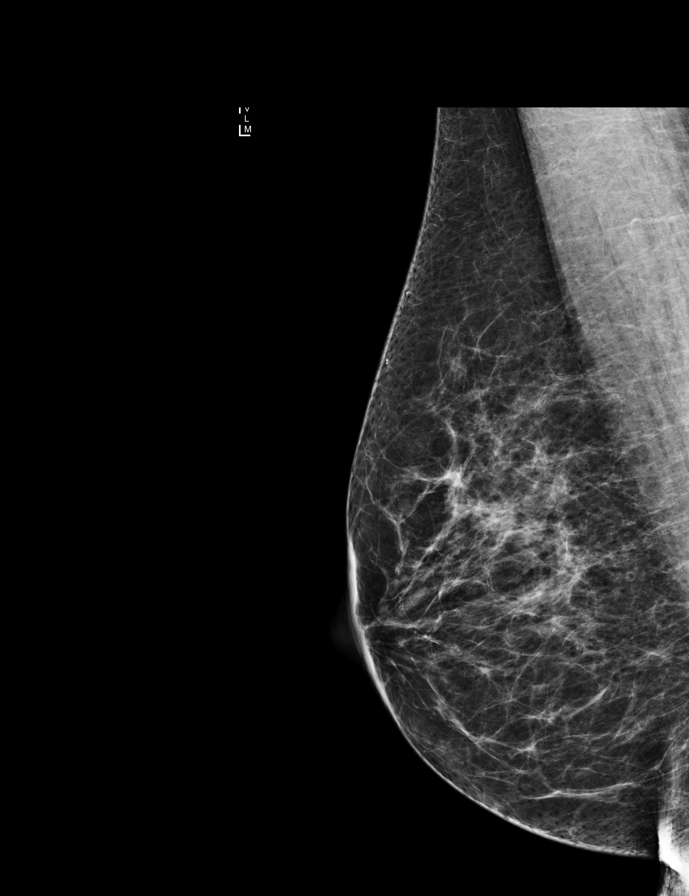

[R CC]
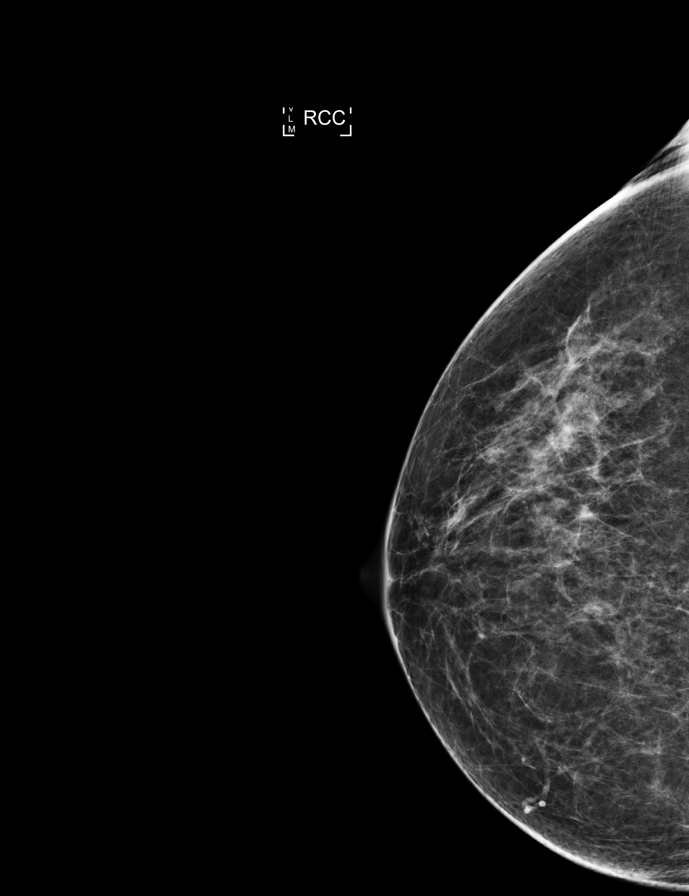

[L MLO synth-2D]
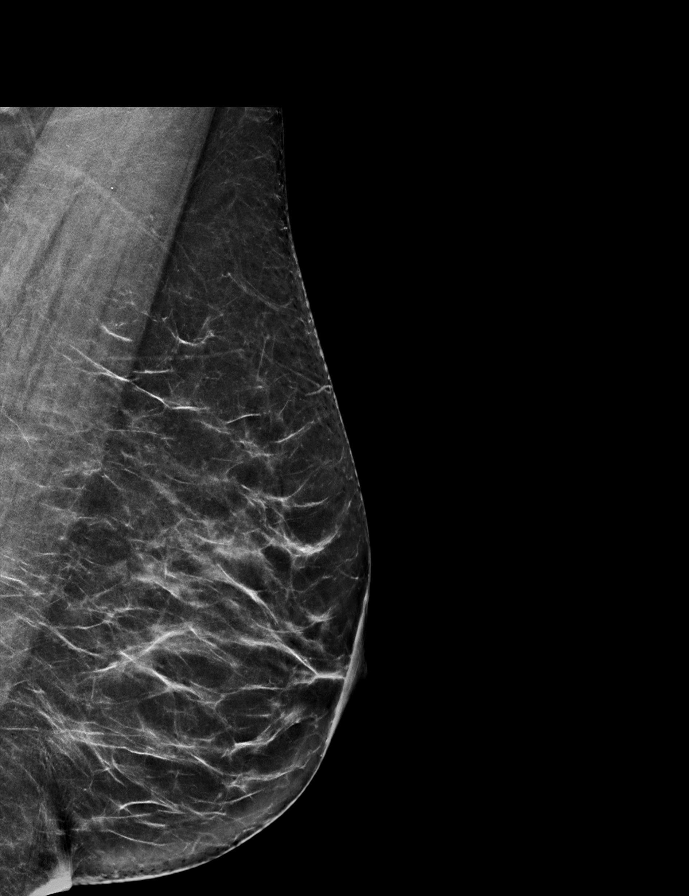

[L CC]
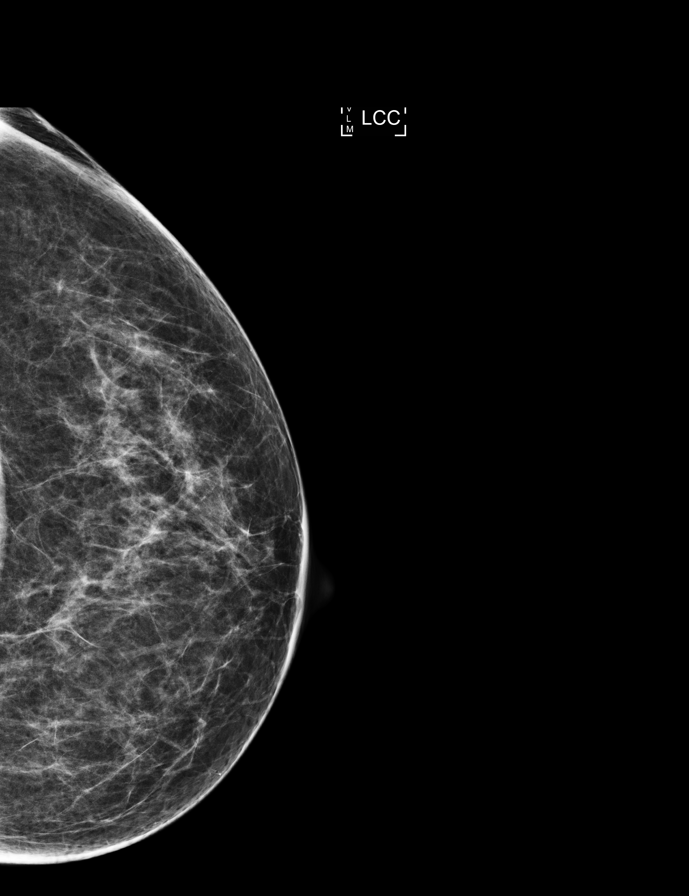

[L CC synth-2D]
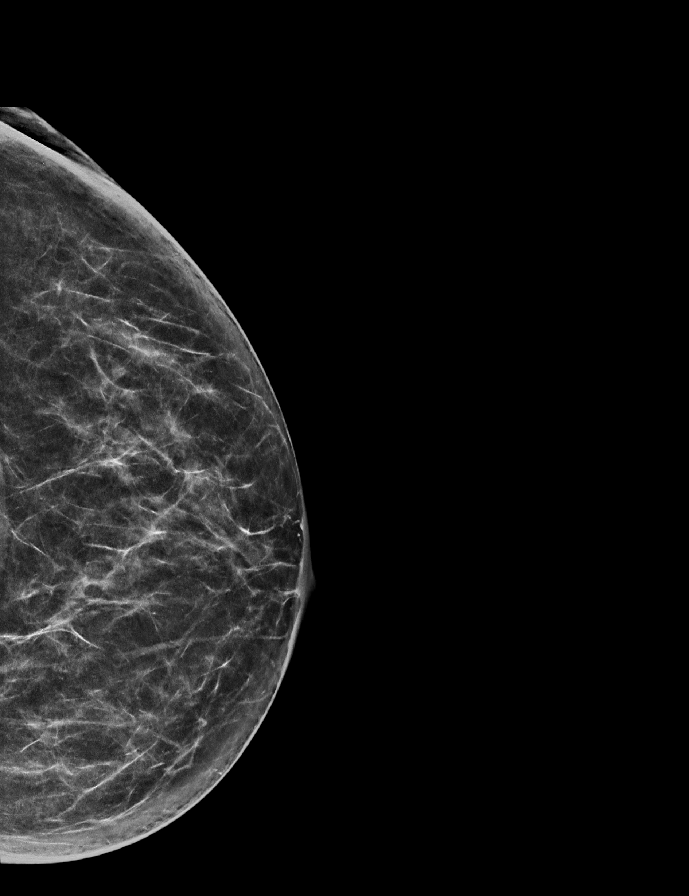

[R MLO synth-2D]
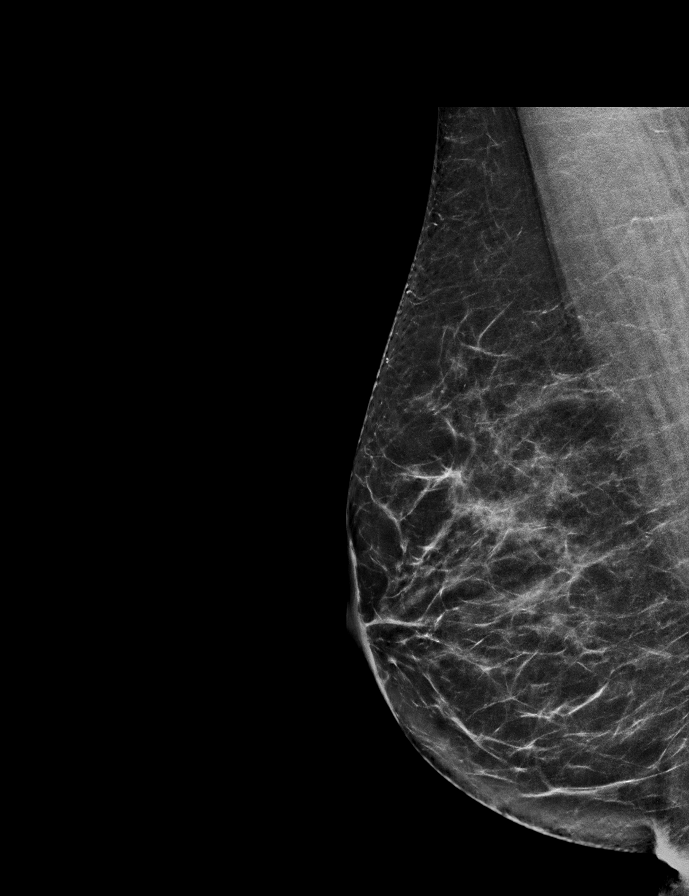

[R CC synth-2D]
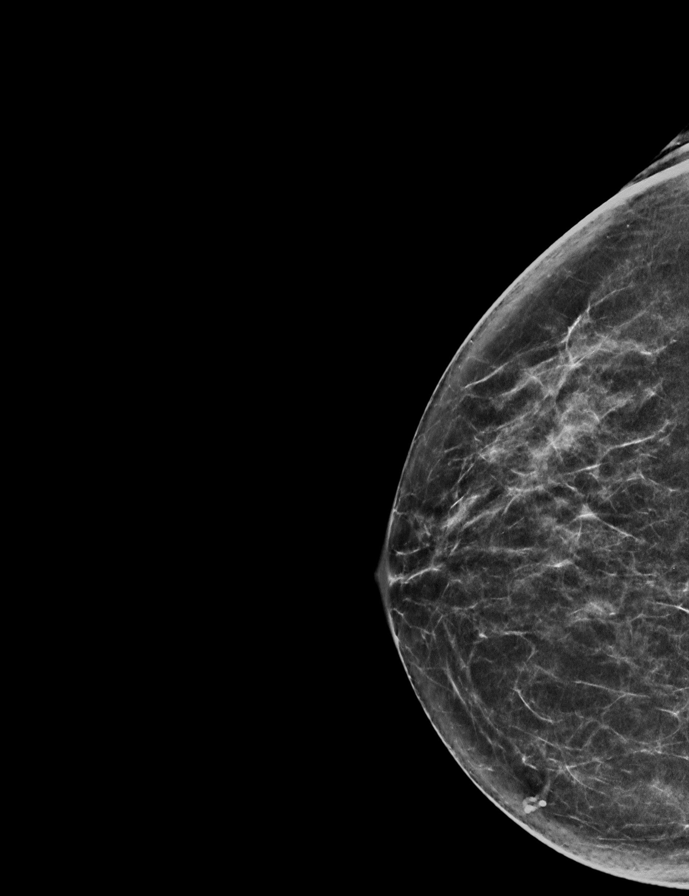

[L MLO]
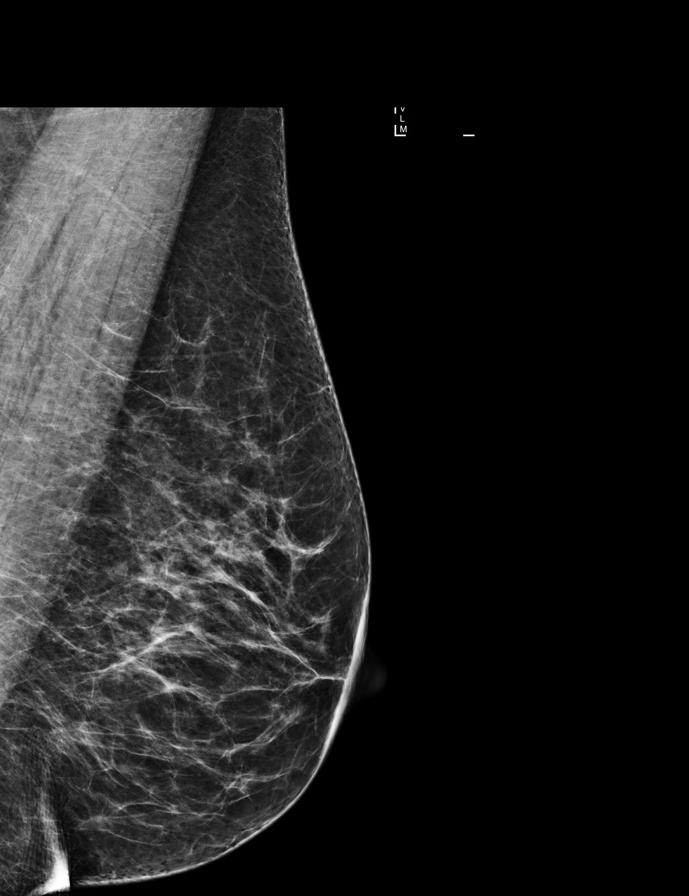

[R MLO tomo · tomo slice 35/69.0]
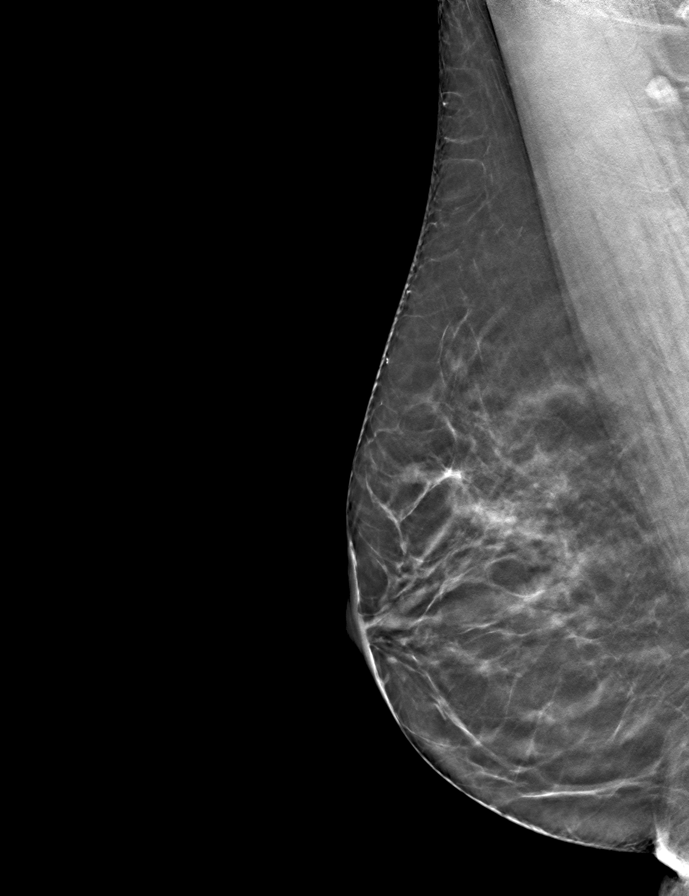

[9 of 28 positions shown; findings below may reference images not displayed]

ACR Breast Density Category b: There are scattered areas of
fibroglandular density.
FINDINGS: There are no findings suspicious for malignancy. Images were
processed with CAD.
IMPRESSION: No mammographic evidence of malignancy. A result letter of this
screening mammogram will be mailed directly to the patient.

RECOMMENDATION:
Screening mammogram in one year. (Code:97-6-RS4)

BI-RADS CATEGORY  1: Negative.

## 2019-01-11 ENCOUNTER — Ambulatory Visit (INDEPENDENT_AMBULATORY_CARE_PROVIDER_SITE_OTHER): Payer: 59 | Admitting: Internal Medicine

## 2019-01-11 ENCOUNTER — Encounter: Payer: Self-pay | Admitting: Internal Medicine

## 2019-01-11 ENCOUNTER — Other Ambulatory Visit: Payer: Self-pay

## 2019-01-11 DIAGNOSIS — E039 Hypothyroidism, unspecified: Secondary | ICD-10-CM

## 2019-01-11 DIAGNOSIS — F439 Reaction to severe stress, unspecified: Secondary | ICD-10-CM | POA: Diagnosis not present

## 2019-01-11 NOTE — Progress Notes (Signed)
Patient ID: Meagan Calhoun, female   DOB: 1956-05-30, 63 y.o.   MRN: [id number] Virtual Visit via Telephone Note  This visit type was conducted due to national recommendations for restrictions regarding the COVID-19 pandemic (e.g. social distancing).  This format is felt to be most appropriate for this patient at this time.  All issues noted in this document were discussed and addressed.  No physical exam was performed (except for noted visual exam findings with Video Visits).   I connected with Meagan Calhoun on 01/11/19 at  3:00 PM EDT by telephone and verified that I am speaking with the correct person using two identifiers. Location patient: home Location provider: work Persons participating in the virtual visit: patient, provider  I discussed the limitations, risks, security and privacy concerns of performing an evaluation and management service by telephone and the availability of in person appointments. The patient expressed understanding and agreed to proceed.   Reason for visit: scheduled follow up.    HPI: She reports she is doing better.  Handling stress better.  They have a lot of land. She has been working outside, Catering manageretc.  Staying active.  No chest pain.  No sob.  No acid reflux.  No abdominal pain.  Bowels moving.  No urine change.  Her mother is doing better.  Sister is now in the same town as her niece and this has made things easier.  Overall feels better.     ROS: See pertinent positives and negatives per HPI.  Past Medical History:  Diagnosis Date  . Abnormal Pap smear of cervix   . Anxiety   . GERD (gastroesophageal reflux disease)   . H/O jaundice   . History of chicken pox   . History of esophageal stricture 2012  . Hx of colonic polyp   . Hypothyroidism     Past Surgical History:  Procedure Laterality Date  . BREAST BIOPSY Left    neg  . CERVICAL BIOPSY  W/ LOOP ELECTRODE EXCISION  08/2013   chronic cervicitis  . CHOLECYSTECTOMY  11/2006   Dr Meagan RollsWilton  Calhoun  . COLONOSCOPY  2012, 2017   normal  . ESOPHAGOGASTRODUODENOSCOPY (EGD) WITH PROPOFOL N/A 06/22/2015   Procedure: ESOPHAGOGASTRODUODENOSCOPY (EGD) WITH PROPOFOL;  Surgeon: Scot Junobert T Elliott, MD;  [facility];  Service: Endoscopy;  Laterality: N/A;  . LAPAROTOMY  1988   ectopic pregnancy  . SAVORY DILATION N/A 06/22/2015   Procedure: SAVORY DILATION;  Surgeon: Scot Junobert T Elliott, MD;  [facility];  Service: Endoscopy;  Laterality: N/A;  . thyroidectomy Right 2013   partial right thyroidectomy  . TUBAL LIGATION    . WISDOM TOOTH EXTRACTION      Family History  Problem Relation Age of Onset  . Hypothyroidism Mother   . Dementia Mother   . Heart disease Father        CHF  . Breast cancer Sister [phone]  . Alzheimer's disease Sister   . Heart attack Brother [phone]  . Colon cancer Neg Hx     SOCIAL HX: reviewed.     Current Outpatient Medications:  .  Biotin 10 MG CAPS, Take by mouth., Disp: , Rfl:  .  hydrocortisone (ANUSOL-HC) 25 MG suppository, Place 1 suppository (25 mg total) rectally 2 (two) times daily., Disp: 14 suppository, Rfl: 0 .  levothyroxine (SYNTHROID, LEVOTHROID) 112 MCG tablet, TAKE 1 TABLET BY MOUTH EVERY MORNING WITH BREAKFAST. NEED APPT AND LABS FOR ADDITIONAL REFILLS, Disp: 90 tablet, Rfl: 1 .  sertraline (ZOLOFT) 50  MG tablet, one tablet q day., Disp: 90 tablet, Rfl: 1 .  traZODone (DESYREL) 50 MG tablet, Take 50 mg by mouth at bedtime., Disp: , Rfl:   EXAM:  GENERAL: alert.  Sounds to be in no acute distress.  Answering questions appropriately.    PSYCH/NEURO: pleasant and cooperative, no obvious depression or anxiety, speech and thought processing grossly intact  ASSESSMENT AND PLAN:  Discussed the following assessment and plan:  Hypothyroidism, unspecified type  Stress  Hypothyroidism On thyroid replacement.  Follow tsh.    Stress Increased stress as outlined.  Better.  On zoloft.  Feels is working well.  Continue zoloft.   Follow.      I discussed the assessment and treatment plan with the patient. The patient was provided an opportunity to ask questions and all were answered. The patient agreed with the plan and demonstrated an understanding of the instructions.   The patient was advised to call back or seek an in-person evaluation if the symptoms worsen or if the condition fails to improve as anticipated.  I provided 20 minutes of non-face-to-face time during this encounter.   Dale [place], MD

## 2019-01-12 ENCOUNTER — Telehealth: Payer: Self-pay | Admitting: Internal Medicine

## 2019-01-12 ENCOUNTER — Other Ambulatory Visit: Payer: Self-pay

## 2019-01-12 DIAGNOSIS — E039 Hypothyroidism, unspecified: Secondary | ICD-10-CM | POA: Diagnosis not present

## 2019-01-12 MED ORDER — SERTRALINE HCL 50 MG PO TABS: ORAL_TABLET | ORAL | 1 refills | Status: DC

## 2019-01-12 NOTE — Telephone Encounter (Signed)
rx sent in for zoloft, synthroid was sent in 90 with 1 rf on 11/20/2018

## 2019-01-12 NOTE — Telephone Encounter (Signed)
Copied from CRM [id number]. Topic: Quick Communication - Rx Refill/Question >> Jan 12, 2019 10:26 AM Fanny Bien wrote: Medication:sertraline (ZOLOFT) 50 MG tablet [[id number]] levothyroxine (SYNTHROID, LEVOTHROID) 112 MCG tablet [[id number]]  needs 90 day supply   Has the patient contacted their pharmacy?   Preferred Pharmacy (with phone number or street name): [id number] [place], [place] - [address] AT [facility] OF [address] [phone] (Phone) [phone] (Fax)    Agent: Please be advised that RX refills may take up to 3 business days. We ask that you follow-up with your pharmacy.

## 2019-01-13 ENCOUNTER — Encounter: Payer: Self-pay | Admitting: Internal Medicine

## 2019-01-13 LAB — TSH: TSH: 2.73 u[IU]/mL (ref 0.450–4.500)

## 2019-01-16 NOTE — Assessment & Plan Note (Signed)
On thyroid replacement.  Follow tsh.  

## 2019-01-16 NOTE — Assessment & Plan Note (Signed)
Increased stress as outlined.  Better.  On zoloft.  Feels is working well.  Continue zoloft.  Follow.

## 2019-03-17 ENCOUNTER — Encounter: Payer: Self-pay | Admitting: Internal Medicine

## 2019-03-18 NOTE — Telephone Encounter (Signed)
See me about this.  

## 2019-04-02 ENCOUNTER — Encounter: Payer: Self-pay | Admitting: Internal Medicine

## 2019-04-07 NOTE — Telephone Encounter (Signed)
See last note and see me about this.

## 2019-04-12 NOTE — Telephone Encounter (Signed)
Pt called today and wanted to set up a New patient appt for her 61 year old mother. Will Dr. Nicki Reaper take pt's mother?

## 2019-04-12 NOTE — Telephone Encounter (Signed)
We have discussed this regarding new pt.  She had asked to establish care with MD here.

## 2019-04-13 NOTE — Telephone Encounter (Signed)
Left message for patient to return call to office , need to discuss mother accepting another PCP ,please forward call to me.

## 2019-04-14 NOTE — Telephone Encounter (Signed)
Called spoke with Ms. Costin and advised her at this time that we have a wonderful provider Dr. Aundra Dubin that is excepting new patients , and with PCP current patient load and the strain of COVID that , patient would be well care for with Dr. Aundra Dubin but that current PCP patient panel is currently filled .

## 2019-06-02 ENCOUNTER — Other Ambulatory Visit: Payer: Self-pay | Admitting: Internal Medicine

## 2019-06-02 MED ORDER — LEVOTHYROXINE SODIUM 112 MCG PO TABS: ORAL_TABLET | ORAL | 0 refills | Status: DC

## 2019-06-02 NOTE — Telephone Encounter (Signed)
Copied from [place] [id number]. Topic: Quick Communication - Rx Refill/Question >> Jun 02, 2019  2:54 PM Mcneil, Ja-Kwan wrote: Medication: levothyroxine (SYNTHROID, LEVOTHROID) 112 MCG tablet  Has the patient contacted their pharmacy? yes   Preferred Pharmacy (with phone number or street name): [id number] [address]ina [address], [place] [phone] (Phone)  [phone] (Fax)  Agent: Please be advised that RX refills may take up to 3 business days. We ask that you follow-up with your pharmacy.

## 2019-06-22 ENCOUNTER — Telehealth: Payer: Self-pay | Admitting: *Deleted

## 2019-06-22 NOTE — Telephone Encounter (Signed)
Copied from [place] [id number]. 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 Pt works at [employer] and would like order put in system for her to go to [facility]

## 2019-06-24 NOTE — Telephone Encounter (Signed)
LMTCB. I don't think [facility] does tb skin tests. If they do, need to know how they need ordered and where it needs to be sent

## 2019-07-05 ENCOUNTER — Other Ambulatory Visit: Payer: Self-pay

## 2019-07-05 ENCOUNTER — Ambulatory Visit (INDEPENDENT_AMBULATORY_CARE_PROVIDER_SITE_OTHER): Payer: 59 | Admitting: Internal Medicine

## 2019-07-05 ENCOUNTER — Ambulatory Visit (INDEPENDENT_AMBULATORY_CARE_PROVIDER_SITE_OTHER): Payer: 59

## 2019-07-05 VITALS — BP 108/68 | HR 60 | Temp 97.3°F | Resp 16 | Wt 170.0 lb

## 2019-07-05 DIAGNOSIS — M79644 Pain in right finger(s): Secondary | ICD-10-CM

## 2019-07-05 DIAGNOSIS — Z0000 Encounter for general adult medical examination without abnormal findings: Secondary | ICD-10-CM | POA: Diagnosis not present

## 2019-07-05 DIAGNOSIS — E039 Hypothyroidism, unspecified: Secondary | ICD-10-CM

## 2019-07-05 DIAGNOSIS — Z13220 Encounter for screening for lipoid disorders: Secondary | ICD-10-CM | POA: Diagnosis not present

## 2019-07-05 DIAGNOSIS — R87619 Unspecified abnormal cytological findings in specimens from cervix uteri: Secondary | ICD-10-CM

## 2019-07-05 DIAGNOSIS — F439 Reaction to severe stress, unspecified: Secondary | ICD-10-CM

## 2019-07-05 MED ORDER — AMOXICILLIN-POT CLAVULANATE 875-125 MG PO TABS: 1.0000 | ORAL_TABLET | Freq: Two times a day (BID) | ORAL | 0 refills | Status: DC

## 2019-07-05 NOTE — Telephone Encounter (Signed)
Discussed with patient at appt today

## 2019-07-05 NOTE — Patient Instructions (Signed)
Take a probiotic daily while you are on the antibiotics and for two weeks after completing the antibiotic.   

## 2019-07-05 NOTE — Progress Notes (Signed)
Patient ID: DAMIYA SANDEFUR, female   DOB: 04-05-1956, 63 y.o.   MRN: [id number]   Subjective:    Patient ID: PALMIRA STICKLE, female    DOB: [birth year], 63 y.o.   MRN: [id number]  HPI  Patient here for a scheduled physical.  Gets her paps and breast exams at gyn.  Persistent increased stress, but overall she feels she is handling things relatively well.  Stress is some better.  Stays active. No chest pain.  No sob.  No acid reflux.  No abdominal pain.  Bowels moving.  Does report was working outside.  Working in [employer].  Notices her right thumb was painful and swollen.  Some redness.  Pain with flexion.  No redness extending up her thumb.     Past Medical History:  Diagnosis Date  . Abnormal Pap smear of cervix   . Anxiety   . GERD (gastroesophageal reflux disease)   . H/O jaundice   . History of chicken pox   . History of esophageal stricture 2012  . Hx of colonic polyp   . Hypothyroidism    Past Surgical History:  Procedure Laterality Date  . BREAST BIOPSY Left    neg  . CERVICAL BIOPSY  W/ LOOP ELECTRODE EXCISION  08/2013   chronic cervicitis  . CHOLECYSTECTOMY  11/2006   Dr Renda Rolls  . COLONOSCOPY  2012, 2017   normal  . ESOPHAGOGASTRODUODENOSCOPY (EGD) WITH PROPOFOL N/A 06/22/2015   Procedure: ESOPHAGOGASTRODUODENOSCOPY (EGD) WITH PROPOFOL;  Surgeon: Scot Jun, MD;  Location: [facility] ENDOSCOPY;  Service: Endoscopy;  Laterality: N/A;  . LAPAROTOMY  1988   ectopic pregnancy  . SAVORY DILATION N/A 06/22/2015   Procedure: SAVORY DILATION;  Surgeon: Scot Jun, MD;  Location: [facility] ENDOSCOPY;  Service: Endoscopy;  Laterality: N/A;  . thyroidectomy Right 2013   partial right thyroidectomy  . TUBAL LIGATION    . WISDOM TOOTH EXTRACTION     Family History  Problem Relation Age of Onset  . Hypothyroidism Mother   . Dementia Mother   . Heart disease Father        CHF  . Breast cancer Sister 33  . Alzheimer's disease Sister   . Heart attack Brother 58   . Colon cancer Neg Hx    Social History   Socioeconomic History  . Marital status: Married    Spouse name: Not on file  . Number of children: 0  . Years of education: 65  . Highest education level: Not on file  Occupational History  . Not on file  Social Needs  . Financial resource strain: Not on file  . Food insecurity    Worry: Not on file    Inability: Not on file  . Transportation needs    Medical: Not on file    Non-medical: Not on file  Tobacco Use  . Smoking status: Never Smoker  . Smokeless tobacco: Never Used  Substance and Sexual Activity  . Alcohol use: Yes    Alcohol/week: 0.0 standard drinks  . Drug use: No  . Sexual activity: Yes    Partners: Male    Birth control/protection: Post-menopausal  Lifestyle  . Physical activity    Days per week: 4 days    Minutes per session: 30 min  . Stress: Not at all  Relationships  . Social connections    Talks on phone: More than three times a week    Gets together: Once a week    Attends religious service: More  than 4 times per year    Active member of club or organization: No    Attends meetings of clubs or organizations: Never    Relationship status: Married  Other Topics Concern  . Not on file  Social History Narrative  . Not on file    Outpatient Encounter Medications as of 07/05/2019  Medication Sig  . amoxicillin-clavulanate (AUGMENTIN) 875-125 MG tablet Take 1 tablet by mouth 2 (two) times daily.  . Biotin 10 MG CAPS Take by mouth.  . hydrocortisone (ANUSOL-HC) 25 MG suppository Place 1 suppository (25 mg total) rectally 2 (two) times daily.  Marland Kitchen levothyroxine (SYNTHROID) 112 MCG tablet TAKE 1 TABLET BY MOUTH EVERY MORNING WITH BREAKFAST. NEED APPT AND LABS FOR ADDITIONAL REFILLS  . sertraline (ZOLOFT) 50 MG tablet one tablet q day.  . traZODone (DESYREL) 50 MG tablet Take 50 mg by mouth at bedtime.   No facility-administered encounter medications on file as of 07/05/2019.    Review of Systems   Constitutional: Negative for appetite change and unexpected weight change.  HENT: Negative for congestion and sinus pressure.   Eyes: Negative for pain and visual disturbance.  Respiratory: Negative for cough, chest tightness and shortness of breath.   Cardiovascular: Negative for chest pain, palpitations and leg swelling.  Gastrointestinal: Negative for abdominal pain, diarrhea, nausea and vomiting.  Genitourinary: Negative for difficulty urinating and dysuria.  Musculoskeletal: Negative for joint swelling and myalgias.       Thumb pain as outlined.  Swelling as outlined.    Skin: Negative for color change and rash.  Neurological: Negative for dizziness, light-headedness and headaches.  Hematological: Negative for adenopathy. Does not bruise/bleed easily.  Psychiatric/Behavioral: Negative for agitation and dysphoric mood.       Objective:    Physical Exam Constitutional:      General: She is not in acute distress.    Appearance: Normal appearance. She is well-developed.  HENT:     Head: Normocephalic and atraumatic.     Right Ear: External ear normal.     Left Ear: External ear normal.  Eyes:     General: No scleral icterus.       Right eye: No discharge.        Left eye: No discharge.     Conjunctiva/sclera: Conjunctivae normal.  Neck:     Musculoskeletal: Neck supple. No muscular tenderness.     Thyroid: No thyromegaly.  Cardiovascular:     Rate and Rhythm: Normal rate and regular rhythm.  Pulmonary:     Effort: No tachypnea, accessory muscle usage or respiratory distress.     Breath sounds: Normal breath sounds. No decreased breath sounds or wheezing.  Chest:     Breasts:        Right: No inverted nipple, mass, nipple discharge or tenderness (no axillary adenopathy).        Left: No inverted nipple, mass, nipple discharge or tenderness (no axilarry adenopathy).  Abdominal:     General: Bowel sounds are normal.     Palpations: Abdomen is soft.     Tenderness: There  is no abdominal tenderness.  Musculoskeletal:        General: No swelling or tenderness.  Lymphadenopathy:     Cervical: No cervical adenopathy.  Skin:    Findings: No rash.     Comments: Increased soft tissue swelling and erythema - right thumb.  Increased pain with flexion of thumb.    Neurological:     Mental Status: She is alert and oriented  to person, place, and time.  Psychiatric:        Mood and Affect: Mood normal.        Behavior: Behavior normal.     BP 108/68   Pulse 60   Temp (!) 97.3 F (36.3 C)   Resp 16   Wt 170 lb (77.1 kg)   SpO2 99%   BMI 24.74 kg/m  Wt Readings from Last 3 Encounters:  07/05/19 170 lb (77.1 kg)  11/13/18 173 lb 3.2 oz (78.6 kg)  09/23/18 170 lb (77.1 kg)     Lab Results  Component Value Date   WBC 3.8 07/05/2019   HGB 12.8 07/05/2019   HCT 38.5 07/05/2019   PLT 211 07/05/2019   GLUCOSE 62 (L) 07/05/2019   CHOL 158 07/05/2019   TRIG 83 07/05/2019   HDL 43 07/05/2019   LDLCALC 99 07/05/2019   ALT 12 07/05/2019   AST 18 07/05/2019   NA 142 07/05/2019   K 4.5 07/05/2019   CL 107 (H) 07/05/2019   CREATININE 0.92 07/05/2019   BUN 17 07/05/2019   CO2 24 07/05/2019   TSH 1.440 07/05/2019    Mm 3d Screen Breast Bilateral  Result Date: 09/24/2018 CLINICAL DATA:  Screening. EXAM: DIGITAL SCREENING BILATERAL MAMMOGRAM WITH TOMO AND CAD COMPARISON:  Previous exam(s). ACR Breast Density Category b: There are scattered areas of fibroglandular density. FINDINGS: There are no findings suspicious for malignancy. Images were processed with CAD. IMPRESSION: No mammographic evidence of malignancy. A result letter of this screening mammogram will be mailed directly to the patient. RECOMMENDATION: Screening mammogram in one year. (Code:SM-B-01Y) BI-RADS CATEGORY  1: Negative. Electronically Signed   By: Sande BrothersSerena  Chacko M.D.   On: 09/24/2018 16:53       Assessment & Plan:   Problem List Items Addressed This Visit    Abnormal Pap smear of cervix     Followed by gyn.  S/p LEEP.  Last evaluated 09/2018.  Recommended f/u in one year.        Health care maintenance    Scheduled for physical today.  Sees gyn for pelvic and pap smears.  Mammogram 09/24/18 - Birads I.  colonoscopy 07/2016.  Recommended f/u in 5 years.      Hypothyroidism    On thyroid replacement.  Follow tsh.       Relevant Orders   CBC with Differential/Platelet (Completed)   Comprehensive metabolic panel (Completed)   TSH (Completed)   Pain of right thumb - Primary    Increased pain and soft tissue swelling of right thumb.  Denies any known trauma.  Was working in [employer].  No known trauma.  Start on augmentin.  Check xray.  Call with update over the next couple of days.        Relevant Orders   DG Finger Thumb Right (Completed)   Stress    Increased stress as outlined.  Overall doing better.  Handling stress better. Follow.         Other Visit Diagnoses    Screening cholesterol level       Relevant Orders   Lipid panel (Completed)       Dale Durhamharlene Jenavee Laguardia, MD

## 2019-07-06 ENCOUNTER — Encounter: Payer: Self-pay | Admitting: Internal Medicine

## 2019-07-06 LAB — LIPID PANEL
Chol/HDL Ratio: 3.7 ratio (ref 0.0–4.4)
Cholesterol, Total: 158 mg/dL (ref 100–199)
HDL: 43 mg/dL (ref >39)
LDL Chol Calc (NIH): 99 mg/dL (ref 0–99)
Triglycerides: 83 mg/dL (ref 0–149)
VLDL Cholesterol Cal: 16 mg/dL (ref 5–40)

## 2019-07-06 LAB — CBC WITH DIFFERENTIAL/PLATELET
Basophils Absolute: 0 10*3/uL (ref 0.0–0.2)
Basos: 1 %
EOS (ABSOLUTE): 0.1 10*3/uL (ref 0.0–0.4)
Eos: 3 %
Hematocrit: 38.5 % (ref 34.0–46.6)
Hemoglobin: 12.8 g/dL (ref 11.1–15.9)
Immature Grans (Abs): 0 10*3/uL (ref 0.0–0.1)
Immature Granulocytes: 0 %
Lymphocytes Absolute: 1.2 10*3/uL (ref 0.7–3.1)
Lymphs: 33 %
MCH: 29.8 pg (ref 26.6–33.0)
MCHC: 33.2 g/dL (ref 31.5–35.7)
MCV: 90 fL (ref 79–97)
Monocytes Absolute: 0.5 10*3/uL (ref 0.1–0.9)
Monocytes: 13 %
Neutrophils Absolute: 1.9 10*3/uL (ref 1.4–7.0)
Neutrophils: 50 %
Platelets: 211 10*3/uL (ref 150–450)
RBC: 4.3 x10E6/uL (ref 3.77–5.28)
RDW: 13.1 % (ref 11.7–15.4)
WBC: 3.8 10*3/uL (ref 3.4–10.8)

## 2019-07-06 LAB — COMPREHENSIVE METABOLIC PANEL
ALT: 12 IU/L (ref 0–32)
AST: 18 IU/L (ref 0–40)
Albumin/Globulin Ratio: 1.5 (ref 1.2–2.2)
Albumin: 4.3 g/dL (ref 3.8–4.8)
Alkaline Phosphatase: 54 IU/L (ref 39–117)
BUN/Creatinine Ratio: 18 (ref 12–28)
BUN: 17 mg/dL (ref 8–27)
Bilirubin Total: 0.4 mg/dL (ref 0.0–1.2)
CO2: 24 mmol/L (ref 20–29)
Calcium: 9.6 mg/dL (ref 8.7–10.3)
Chloride: 107 mmol/L — ABNORMAL HIGH (ref 96–106)
Creatinine, Ser: 0.92 mg/dL (ref 0.57–1.00)
GFR calc Af Amer: 78 mL/min/{1.73_m2} (ref >59)
GFR calc non Af Amer: 67 mL/min/{1.73_m2} (ref >59)
Globulin, Total: 2.9 g/dL (ref 1.5–4.5)
Glucose: 62 mg/dL — ABNORMAL LOW (ref 65–99)
Potassium: 4.5 mmol/L (ref 3.5–5.2)
Sodium: 142 mmol/L (ref 134–144)
Total Protein: 7.2 g/dL (ref 6.0–8.5)

## 2019-07-06 LAB — TSH: TSH: 1.44 u[IU]/mL (ref 0.450–4.500)

## 2019-07-07 ENCOUNTER — Encounter: Payer: Self-pay | Admitting: Internal Medicine

## 2019-07-11 ENCOUNTER — Encounter: Payer: Self-pay | Admitting: Internal Medicine

## 2019-07-11 DIAGNOSIS — M79644 Pain in right finger(s): Secondary | ICD-10-CM | POA: Insufficient documentation

## 2019-07-11 NOTE — Assessment & Plan Note (Signed)
Increased pain and soft tissue swelling of right thumb.  Denies any known trauma.  Was working in [employer].  No known trauma.  Start on augmentin.  Check xray.  Call with update over the next couple of days.

## 2019-07-11 NOTE — Assessment & Plan Note (Signed)
On thyroid replacement.  Follow tsh.  

## 2019-07-11 NOTE — Assessment & Plan Note (Signed)
Increased stress as outlined.  Overall doing better.  Handling stress better. Follow.

## 2019-07-11 NOTE — Assessment & Plan Note (Signed)
Followed by gyn.  S/p LEEP.  Last evaluated 09/2018.  Recommended f/u in one year.

## 2019-07-11 NOTE — Assessment & Plan Note (Signed)
Scheduled for physical today.  Sees gyn for pelvic and pap smears.  Mammogram 09/24/18 - Birads I.  colonoscopy 07/2016.  Recommended f/u in 5 years.

## 2019-07-16 ENCOUNTER — Telehealth: Payer: Self-pay

## 2019-07-16 NOTE — Telephone Encounter (Signed)
Copied from [place] [id number]. Topic: General - Other >> Jul 16, 2019  4:18 PM Celene Kras A wrote: Reason for CRM: Pt called requesting to have a tb test done. Please advise.

## 2019-07-19 NOTE — Telephone Encounter (Signed)
Yes.  Confirm finger clear and she is off abx.

## 2019-07-19 NOTE — Telephone Encounter (Signed)
Pt was advised to hold on TB skin test at last visit. Are you okay with her coming in to get it done?

## 2019-07-20 NOTE — Telephone Encounter (Signed)
Finger is clear and pt is off of abx. Pt scheduled for placement and read.

## 2019-07-23 ENCOUNTER — Other Ambulatory Visit: Payer: Self-pay | Admitting: Internal Medicine

## 2019-07-23 NOTE — Telephone Encounter (Signed)
Pt called stating she is completely out of the medication and is requesting to have this medication sent in today. Please advise.

## 2019-07-26 ENCOUNTER — Other Ambulatory Visit: Payer: Self-pay

## 2019-07-26 ENCOUNTER — Ambulatory Visit (INDEPENDENT_AMBULATORY_CARE_PROVIDER_SITE_OTHER): Payer: 59

## 2019-07-26 DIAGNOSIS — Z111 Encounter for screening for respiratory tuberculosis: Secondary | ICD-10-CM | POA: Diagnosis not present

## 2019-07-26 NOTE — Telephone Encounter (Signed)
Refill was sent on 11/6.

## 2019-07-28 ENCOUNTER — Other Ambulatory Visit: Payer: Self-pay

## 2019-07-28 ENCOUNTER — Ambulatory Visit: Payer: 59 | Admitting: *Deleted

## 2019-07-28 DIAGNOSIS — Z111 Encounter for screening for respiratory tuberculosis: Secondary | ICD-10-CM

## 2019-07-28 LAB — TB SKIN TEST
Induration: 0 mm
TB Skin Test: NEGATIVE

## 2019-07-28 NOTE — Progress Notes (Signed)
PPD test read.

## 2019-08-16 ENCOUNTER — Ambulatory Visit (INDEPENDENT_AMBULATORY_CARE_PROVIDER_SITE_OTHER): Payer: 59

## 2019-08-16 ENCOUNTER — Other Ambulatory Visit: Payer: Self-pay

## 2019-08-16 DIAGNOSIS — Z111 Encounter for screening for respiratory tuberculosis: Secondary | ICD-10-CM

## 2019-08-17 NOTE — Progress Notes (Signed)
Pt presented for PPD skin test.  Administered intradermal injection of Tubersol inside of left forearm.

## 2019-08-18 ENCOUNTER — Other Ambulatory Visit: Payer: Self-pay

## 2019-08-18 ENCOUNTER — Ambulatory Visit: Payer: 59 | Admitting: *Deleted

## 2019-08-18 DIAGNOSIS — Z111 Encounter for screening for respiratory tuberculosis: Secondary | ICD-10-CM

## 2019-08-18 LAB — TB SKIN TEST
Induration: 0 mm
TB Skin Test: NEGATIVE

## 2019-08-18 NOTE — Progress Notes (Signed)
Placed copy of PPD for patient at front desk as requested patient will pick up on 08/19/19

## 2019-09-02 ENCOUNTER — Other Ambulatory Visit: Payer: Self-pay

## 2019-09-02 ENCOUNTER — Telehealth: Payer: Self-pay | Admitting: Internal Medicine

## 2019-09-02 MED ORDER — LEVOTHYROXINE SODIUM 112 MCG PO TABS: ORAL_TABLET | ORAL | 1 refills | Status: DC

## 2019-09-02 NOTE — Telephone Encounter (Signed)
Medication refilled

## 2019-09-02 NOTE — Telephone Encounter (Signed)
Pt needs a refill on levothyroxine (SYNTHROID) 112 MCG tablet sent to [place] Pt only has 2 pills left

## 2019-09-08 ENCOUNTER — Other Ambulatory Visit: Payer: Self-pay | Admitting: Internal Medicine

## 2019-09-08 DIAGNOSIS — Z1231 Encounter for screening mammogram for malignant neoplasm of breast: Secondary | ICD-10-CM

## 2019-10-07 ENCOUNTER — Ambulatory Visit
Admission: RE | Admit: 2019-10-07 | Discharge: 2019-10-07 | Disposition: A | Discharge status: Home / Self Care | Payer: 59 | Source: Ambulatory Visit | Attending: Internal Medicine | Admitting: Internal Medicine

## 2019-10-07 DIAGNOSIS — Z1231 Encounter for screening mammogram for malignant neoplasm of breast: Secondary | ICD-10-CM

## 2020-01-17 ENCOUNTER — Other Ambulatory Visit: Payer: Self-pay | Admitting: Internal Medicine

## 2020-02-24 ENCOUNTER — Other Ambulatory Visit: Payer: Self-pay | Admitting: Internal Medicine

## 2020-07-06 ENCOUNTER — Telehealth: Payer: Self-pay

## 2020-07-06 NOTE — Telephone Encounter (Signed)
Advised patient that she has not been seen in 1 year and will need to be seen in order to adjust her medication. I have her scheduled for 10/26. Patient agreed that she was okay to wait until next week.

## 2020-07-06 NOTE — Telephone Encounter (Signed)
Pt wants to know if Dr. Lorin Picket will increase her sertraline. She said with her mom's dementia and work she feels she needs her to increase her medication. Pt said today has not been a good day.

## 2020-07-11 ENCOUNTER — Other Ambulatory Visit: Payer: Self-pay

## 2020-07-11 ENCOUNTER — Ambulatory Visit: Payer: 59 | Admitting: Internal Medicine

## 2020-07-11 VITALS — BP 110/70 | HR 57 | Temp 97.6°F | Ht 69.5 in | Wt 174.8 lb

## 2020-07-11 DIAGNOSIS — F439 Reaction to severe stress, unspecified: Secondary | ICD-10-CM

## 2020-07-11 DIAGNOSIS — R079 Chest pain, unspecified: Secondary | ICD-10-CM | POA: Diagnosis not present

## 2020-07-11 DIAGNOSIS — E039 Hypothyroidism, unspecified: Secondary | ICD-10-CM | POA: Diagnosis not present

## 2020-07-11 DIAGNOSIS — Z13220 Encounter for screening for lipoid disorders: Secondary | ICD-10-CM

## 2020-07-11 MED ORDER — SERTRALINE HCL 100 MG PO TABS
100.0000 mg | ORAL_TABLET | Freq: Every day | ORAL | 2 refills | Status: DC
Start: 2020-07-11 — End: 2020-09-25

## 2020-07-11 NOTE — Progress Notes (Signed)
Patient ID: Meagan Calhoun, female   DOB: 1955-11-16, 64 y.o.   MRN: [id number]   Subjective:    Patient ID: Meagan Calhoun, female    DOB: 10/30/1955, 64 y.o.   MRN: [id number]  HPI This visit occurred during the SARS-CoV-2 public health emergency.  Safety protocols were in place, including screening questions prior to the visit, additional usage of staff PPE, and extensive cleaning of exam room while observing appropriate contact time as indicated for disinfecting solutions.  Patient here as a work in to discussed increased stress and anxiety.  Increased stress with her mother's health issues.  Her mother lives with her.  Up and down most nights.  Has to clean, bathe and take care of her.  Husband dose help when he is able.  They were able to get away for a short period recently.  On zoloft.  Feels that it helps, but feels she needs to adjust the dose to help level things out.  Discussed zoloft and increasing the dose.  Stays active.  No chest pain or sob.  No acid reflux or abdominal pain reported.  Bowels moving.  No chest pain or sob with increased activity or exertion, but she will feel some tightness when gets anxious.  Previously had cardiac w/up.  Unrevealing.  She feels is related to anxiety.  Discussed.  Wants to try medication adjust, but will notify me if symptoms change or any persistent problems.    Past Medical History:  Diagnosis Date  . Abnormal Pap smear of cervix   . Anxiety   . GERD (gastroesophageal reflux disease)   . H/O jaundice   . History of chicken pox   . History of esophageal stricture 2012  . Hx of colonic polyp   . Hypothyroidism    Past Surgical History:  Procedure Laterality Date  . BREAST BIOPSY Left    neg  . CERVICAL BIOPSY  W/ LOOP ELECTRODE EXCISION  08/2013   chronic cervicitis  . CHOLECYSTECTOMY  11/2006   Dr Renda Rolls  . COLONOSCOPY  2012, 2017   normal  . ESOPHAGOGASTRODUODENOSCOPY (EGD) WITH PROPOFOL N/A 06/22/2015   Procedure:  ESOPHAGOGASTRODUODENOSCOPY (EGD) WITH PROPOFOL;  Surgeon: Scot Jun, MD;  Location: [facility] ENDOSCOPY;  Service: Endoscopy;  Laterality: N/A;  . LAPAROTOMY  1988   ectopic pregnancy  . SAVORY DILATION N/A 06/22/2015   Procedure: SAVORY DILATION;  Surgeon: Scot Jun, MD;  Location[facility] ENDOSCOPY;  Service: Endoscopy;  Laterality: N/A;  . thyroidectomy Right 2013   partial right thyroidectomy  . TUBAL LIGATION    . WISDOM TOOTH EXTRACTION     Family History  Problem Relation Age of Onset  . Hypothyroidism Mother   . Dementia Mother   . Heart disease Father        CHF  . Breast cancer Sister 48  . Alzheimer's disease Sister   . Heart attack Brother 20  . Colon cancer Neg Hx    Social History   Socioeconomic History  . Marital status: Married    Spouse name: Not on file  . Number of children: 0  . Years of education: 80  . Highest education level: Not on file  Occupational History  . Not on file  Tobacco Use  . Smoking status: Never Smoker  . Smokeless tobacco: Never Used  Vaping Use  . Vaping Use: Never used  Substance and Sexual Activity  . Alcohol use: Yes    Alcohol/week: 0.0 standard drinks  . Drug  use: No  . Sexual activity: Yes    Partners: Male    Birth control/protection: Post-menopausal  Other Topics Concern  . Not on file  Social History Narrative  . Not on file   Social Determinants of Health   Financial Resource Strain:   . Difficulty of Paying Living Expenses: Not on file  Food Insecurity:   . Worried About Programme researcher, broadcasting/film/video in the Last Year: Not on file  . Ran Out of Food in the Last Year: Not on file  Transportation Needs:   . Lack of Transportation (Medical): Not on file  . Lack of Transportation (Non-Medical): Not on file  Physical Activity:   . Days of Exercise per Week: Not on file  . Minutes of Exercise per Session: Not on file  Stress:   . Feeling of Stress : Not on file  Social Connections:   . Frequency of Communication  with Friends and Family: Not on file  . Frequency of Social Gatherings with Friends and Family: Not on file  . Attends Religious Services: Not on file  . Active Member of Clubs or Organizations: Not on file  . Attends Banker Meetings: Not on file  . Marital Status: Not on file   Outpatient Encounter Medications as of 07/11/2020  Medication Sig  . amoxicillin-clavulanate (AUGMENTIN) 875-125 MG tablet Take 1 tablet by mouth 2 (two) times daily.  . Biotin 10 MG CAPS Take by mouth.  . hydrocortisone (ANUSOL-HC) 25 MG suppository Place 1 suppository (25 mg total) rectally 2 (two) times daily.  Marland Kitchen levothyroxine (SYNTHROID) 112 MCG tablet TAKE 1 TABLET BY MOUTH EVERY MORNING WITH BREAKFAST  . sertraline (ZOLOFT) 100 MG tablet Take 1 tablet (100 mg total) by mouth daily.  . traZODone (DESYREL) 50 MG tablet Take 50 mg by mouth at bedtime.  . [DISCONTINUED] sertraline (ZOLOFT) 50 MG tablet TAKE 1 TABLET BY MOUTH EVERY DAY   No facility-administered encounter medications on file as of 07/11/2020.    Review of Systems  Constitutional: Negative for appetite change and unexpected weight change.  HENT: Negative for congestion and sinus pressure.   Respiratory: Negative for cough, chest tightness and shortness of breath.   Cardiovascular: Negative for chest pain, palpitations and leg swelling.  Gastrointestinal: Negative for abdominal pain, diarrhea, nausea and vomiting.  Genitourinary: Negative for difficulty urinating and dysuria.  Musculoskeletal: Negative for joint swelling and myalgias.  Skin: Negative for color change and rash.  Neurological: Negative for dizziness, light-headedness and headaches.  Psychiatric/Behavioral: Negative for dysphoric mood.       Increased stress as outlined.         Objective:    Physical Exam Vitals reviewed.  Constitutional:      General: She is not in acute distress.    Appearance: Normal appearance.  HENT:     Head: Normocephalic and  atraumatic.     Right Ear: External ear normal.     Left Ear: External ear normal.  Eyes:     General: No scleral icterus.       Right eye: No discharge.        Left eye: No discharge.     Conjunctiva/sclera: Conjunctivae normal.  Neck:     Thyroid: No thyromegaly.  Cardiovascular:     Rate and Rhythm: Normal rate and regular rhythm.  Pulmonary:     Effort: No respiratory distress.     Breath sounds: Normal breath sounds. No wheezing.  Abdominal:     General: Bowel  sounds are normal.     Palpations: Abdomen is soft.     Tenderness: There is no abdominal tenderness.  Musculoskeletal:        General: No swelling or tenderness.     Cervical back: Neck supple. No tenderness.  Lymphadenopathy:     Cervical: No cervical adenopathy.  Skin:    Findings: No erythema or rash.  Neurological:     Mental Status: She is alert.  Psychiatric:        Mood and Affect: Mood normal.        Behavior: Behavior normal.     BP 110/70   Pulse (!) 57   Temp 97.6 F (36.4 C) (Oral)   Ht 5' 9.5" (1.765 m)   Wt 174 lb 12.8 oz (79.3 kg)   SpO2 96%   BMI 25.44 kg/m  Wt Readings from Last 3 Encounters:  07/11/20 174 lb 12.8 oz (79.3 kg)  07/05/19 170 lb (77.1 kg)  11/13/18 173 lb 3.2 oz (78.6 kg)     Lab Results  Component Value Date   WBC 3.7 07/13/2020   HGB 13.6 07/13/2020   HCT 42.0 07/13/2020   PLT 216 07/13/2020   GLUCOSE 94 07/13/2020   CHOL 175 07/13/2020   TRIG 66 07/13/2020   HDL 42 07/13/2020   LDLCALC 120 (H) 07/13/2020   ALT 13 07/13/2020   AST 18 07/13/2020   NA 141 07/13/2020   K 4.4 07/13/2020   CL 104 07/13/2020   CREATININE 1.03 (H) 07/13/2020   BUN 13 07/13/2020   CO2 24 07/13/2020   TSH 4.710 (H) 07/13/2020    MM 3D SCREEN BREAST BILATERAL  Result Date: 10/09/2019 CLINICAL DATA:  Screening. EXAM: DIGITAL SCREENING BILATERAL MAMMOGRAM WITH TOMO AND CAD COMPARISON:  Previous exam(s). ACR Breast Density Category b: There are scattered areas of fibroglandular  density. FINDINGS: There are no findings suspicious for malignancy. Images were processed with CAD. IMPRESSION: No mammographic evidence of malignancy. A result letter of this screening mammogram will be mailed directly to the patient. RECOMMENDATION: Screening mammogram in one year. (Code:SM-B-01Y) BI-RADS CATEGORY  1: Negative. Electronically Signed   By: Sande Brothers M.D.   On: 10/09/2019 11:43       Assessment & Plan:   Problem List Items Addressed This Visit    Stress    Increased stress as outlined.  Discussed with her today.  Discussed counseling.  Discussed scheduled time away.  Discussed treatment - increasing zoloft.  She feels zoloft is working for her. Increase to 100mg  q day.  Follow closely.  Will notify me if desires any further intervention of if any problems.        Hypothyroidism - Primary    On thyroid replacement.  Follow tsh.       Relevant Orders   CBC with Differential/Platelet (Completed)   Comprehensive metabolic panel (Completed)   TSH (Completed)   Chest pain    Previously evaluated by cardiology.  No chest pain or sob with increased activity or exertion.  Feels related to stress. Increase zoloft as outlined.  Follow closely.  Any change or persistent symptoms, she is to be evaluated.        Other Visit Diagnoses    Screening cholesterol level       Relevant Orders   Lipid panel (Completed)       , MD

## 2020-07-14 LAB — COMPREHENSIVE METABOLIC PANEL
ALT: 13 IU/L (ref 0–32)
AST: 18 IU/L (ref 0–40)
Albumin/Globulin Ratio: 1.6 (ref 1.2–2.2)
Albumin: 4.5 g/dL (ref 3.8–4.8)
Alkaline Phosphatase: 51 IU/L (ref 44–121)
BUN/Creatinine Ratio: 13 (ref 12–28)
BUN: 13 mg/dL (ref 8–27)
Bilirubin Total: 0.5 mg/dL (ref 0.0–1.2)
CO2: 24 mmol/L (ref 20–29)
Calcium: 9.5 mg/dL (ref 8.7–10.3)
Chloride: 104 mmol/L (ref 96–106)
Creatinine, Ser: 1.03 mg/dL — ABNORMAL HIGH (ref 0.57–1.00)
GFR calc Af Amer: 67 mL/min/{1.73_m2} (ref >59)
GFR calc non Af Amer: 58 mL/min/{1.73_m2} — ABNORMAL LOW (ref >59)
Globulin, Total: 2.9 g/dL (ref 1.5–4.5)
Glucose: 94 mg/dL (ref 65–99)
Potassium: 4.4 mmol/L (ref 3.5–5.2)
Sodium: 141 mmol/L (ref 134–144)
Total Protein: 7.4 g/dL (ref 6.0–8.5)

## 2020-07-14 LAB — CBC WITH DIFFERENTIAL/PLATELET
Basophils Absolute: 0 10*3/uL (ref 0.0–0.2)
Basos: 1 %
EOS (ABSOLUTE): 0.2 10*3/uL (ref 0.0–0.4)
Eos: 4 %
Hematocrit: 42 % (ref 34.0–46.6)
Hemoglobin: 13.6 g/dL (ref 11.1–15.9)
Immature Grans (Abs): 0 10*3/uL (ref 0.0–0.1)
Immature Granulocytes: 0 %
Lymphocytes Absolute: 1.2 10*3/uL (ref 0.7–3.1)
Lymphs: 33 %
MCH: 29.5 pg (ref 26.6–33.0)
MCHC: 32.4 g/dL (ref 31.5–35.7)
MCV: 91 fL (ref 79–97)
Monocytes Absolute: 0.4 10*3/uL (ref 0.1–0.9)
Monocytes: 11 %
Neutrophils Absolute: 1.9 10*3/uL (ref 1.4–7.0)
Neutrophils: 51 %
Platelets: 216 10*3/uL (ref 150–450)
RBC: 4.61 x10E6/uL (ref 3.77–5.28)
RDW: 13.2 % (ref 11.7–15.4)
WBC: 3.7 10*3/uL (ref 3.4–10.8)

## 2020-07-14 LAB — LIPID PANEL
Chol/HDL Ratio: 4.2 ratio (ref 0.0–4.4)
Cholesterol, Total: 175 mg/dL (ref 100–199)
HDL: 42 mg/dL (ref >39)
LDL Chol Calc (NIH): 120 mg/dL — ABNORMAL HIGH (ref 0–99)
Triglycerides: 66 mg/dL (ref 0–149)
VLDL Cholesterol Cal: 13 mg/dL (ref 5–40)

## 2020-07-14 LAB — TSH: TSH: 4.71 u[IU]/mL — ABNORMAL HIGH (ref 0.450–4.500)

## 2020-07-15 ENCOUNTER — Other Ambulatory Visit: Payer: Self-pay | Admitting: Internal Medicine

## 2020-07-15 DIAGNOSIS — E039 Hypothyroidism, unspecified: Secondary | ICD-10-CM

## 2020-07-15 DIAGNOSIS — R944 Abnormal results of kidney function studies: Secondary | ICD-10-CM

## 2020-07-15 NOTE — Progress Notes (Signed)
Order placed for f/u labs.  

## 2020-07-16 ENCOUNTER — Encounter: Payer: Self-pay | Admitting: Internal Medicine

## 2020-07-16 NOTE — Assessment & Plan Note (Signed)
Increased stress as outlined.  Discussed with her today.  Discussed counseling.  Discussed scheduled time away.  Discussed treatment - increasing zoloft.  She feels zoloft is working for her. Increase to 100mg  q day.  Follow closely.  Will notify me if desires any further intervention of if any problems.

## 2020-07-16 NOTE — Assessment & Plan Note (Signed)
Previously evaluated by cardiology.  No chest pain or sob with increased activity or exertion.  Feels related to stress. Increase zoloft as outlined.  Follow closely.  Any change or persistent symptoms, she is to be evaluated.

## 2020-07-16 NOTE — Assessment & Plan Note (Signed)
On thyroid replacement.  Follow tsh.  

## 2020-07-19 ENCOUNTER — Other Ambulatory Visit: Payer: Self-pay

## 2020-07-19 MED ORDER — LEVOTHYROXINE SODIUM 125 MCG PO TABS: 125.0000 ug | ORAL_TABLET | Freq: Every day | ORAL | 0 refills | Status: DC

## 2020-07-21 ENCOUNTER — Other Ambulatory Visit: Payer: Self-pay | Admitting: Internal Medicine

## 2020-07-21 DIAGNOSIS — E039 Hypothyroidism, unspecified: Secondary | ICD-10-CM

## 2020-07-21 NOTE — Progress Notes (Signed)
Orders placed for [facility] labs.   

## 2020-07-29 ENCOUNTER — Other Ambulatory Visit: Payer: Self-pay | Admitting: Internal Medicine

## 2020-08-31 ENCOUNTER — Other Ambulatory Visit: Payer: Self-pay | Admitting: Internal Medicine

## 2020-09-07 LAB — BASIC METABOLIC PANEL
BUN/Creatinine Ratio: 19 (ref 12–28)
BUN: 18 mg/dL (ref 8–27)
CO2: 23 mmol/L (ref 20–29)
Calcium: 9.7 mg/dL (ref 8.7–10.3)
Chloride: 101 mmol/L (ref 96–106)
Creatinine, Ser: 0.94 mg/dL (ref 0.57–1.00)
GFR calc Af Amer: 75 mL/min/{1.73_m2} (ref >59)
GFR calc non Af Amer: 65 mL/min/{1.73_m2} (ref >59)
Glucose: 93 mg/dL (ref 65–99)
Potassium: 4.3 mmol/L (ref 3.5–5.2)
Sodium: 138 mmol/L (ref 134–144)

## 2020-09-07 LAB — TSH: TSH: 4.67 u[IU]/mL — ABNORMAL HIGH (ref 0.450–4.500)

## 2020-09-11 MED ORDER — LEVOTHYROXINE SODIUM 137 MCG PO TABS: 137.0000 ug | ORAL_TABLET | Freq: Every day | ORAL | 1 refills | Status: DC

## 2020-09-11 NOTE — Addendum Note (Signed)
Addended by: Elise Benne T on: 09/11/2020 03:18 PM   Modules accepted: Orders

## 2020-09-23 ENCOUNTER — Other Ambulatory Visit: Payer: Self-pay | Admitting: Internal Medicine

## 2020-09-25 ENCOUNTER — Ambulatory Visit (INDEPENDENT_AMBULATORY_CARE_PROVIDER_SITE_OTHER): Payer: 59 | Admitting: Obstetrics

## 2020-09-25 ENCOUNTER — Other Ambulatory Visit: Payer: Self-pay

## 2020-09-25 ENCOUNTER — Encounter: Payer: Self-pay | Admitting: Obstetrics

## 2020-09-25 ENCOUNTER — Other Ambulatory Visit: Payer: Self-pay | Admitting: Internal Medicine

## 2020-09-25 VITALS — BP 90/60 | Ht 69.0 in | Wt 177.0 lb

## 2020-09-25 DIAGNOSIS — Z01419 Encounter for gynecological examination (general) (routine) without abnormal findings: Secondary | ICD-10-CM

## 2020-09-25 DIAGNOSIS — Z124 Encounter for screening for malignant neoplasm of cervix: Secondary | ICD-10-CM | POA: Diagnosis not present

## 2020-09-25 DIAGNOSIS — Z1231 Encounter for screening mammogram for malignant neoplasm of breast: Secondary | ICD-10-CM

## 2020-09-25 NOTE — Progress Notes (Signed)
Routine Annual Gynecology Examination   PCP: Dale Harriman, MD  Chief Complaint:  Chief Complaint  Patient presents with  . Gynecologic Exam    No concerns  . [employer] Employee    History of Present Illness: Patient is a 65 y.o. G1P0010 presents for annual exam. The patient has no complaints today. She is scheduling her annual mammogram for this month. Trish works remotely for [employer]. She is married and her husband is retired. They take care of her 39 yo mother who has dementia.  Rosann Auerbach has been postmenpausal since her late 45s.  Menopausal bleeding: denies  Menopausal symptoms: denies  Breast symptoms: denies  Last pap smear: 2 years ago.  Result Normal  Last mammogram: 1 years ago.  Result Normal  Past Medical History:  Diagnosis Date  . Abnormal Pap smear of cervix   . Anxiety   . GERD (gastroesophageal reflux disease)   . H/O jaundice   . History of chicken pox   . History of esophageal stricture 2012  . Hx of colonic polyp   . Hypothyroidism     Past Surgical History:  Procedure Laterality Date  . BREAST BIOPSY Left    neg  . CERVICAL BIOPSY  W/ LOOP ELECTRODE EXCISION  08/2013   chronic cervicitis  . CHOLECYSTECTOMY  11/2006   Dr Renda Rolls  . COLONOSCOPY  2012, 2017   normal  . ESOPHAGOGASTRODUODENOSCOPY (EGD) WITH PROPOFOL N/A 06/22/2015   Procedure: ESOPHAGOGASTRODUODENOSCOPY (EGD) WITH PROPOFOL;  Surgeon: Scot Jun, MD;  Location: [facility] ENDOSCOPY;  Service: Endoscopy;  Laterality: N/A;  . LAPAROTOMY  1988   ectopic pregnancy  . SAVORY DILATION N/A 06/22/2015   Procedure: SAVORY DILATION;  Surgeon: Scot Jun, MD;  Location: [facility] ENDOSCOPY;  Service: Endoscopy;  Laterality: N/A;  . thyroidectomy Right 2013   partial right thyroidectomy  . TUBAL LIGATION    . WISDOM TOOTH EXTRACTION      Prior to Admission medications   Medication Sig Start Date End Date Taking? Authorizing Provider  Biotin 10 MG CAPS Take by mouth.   Yes [provider]  levothyroxine (SYNTHROID) 137 MCG tablet Take 1 tablet (137 mcg total) by mouth daily before breakfast. 09/11/20  Yes Dale Hemphill, MD  sertraline (ZOLOFT) 100 MG tablet TAKE 1 TABLET(100 MG) BY MOUTH DAILY 09/25/20  Yes Dale Reno, MD  sertraline (ZOLOFT) 100 MG tablet TAKE 1 TABLET(100 MG) BY MOUTH DAILY 09/25/20  Yes Dale Tierra Bonita, MD    No Known Allergies  Gynecologic History:  No LMP recorded. Patient is postmenopausal. Contraception: post menopausal status Last Pap: 2020. Results were: normal Last mammogram: 2020. Results were: normal  Obstetric History: G1P0010  Social History   Socioeconomic History  . Marital status: Married    Spouse name: Not on file  . Number of children: 0  . Years of education: 52  . Highest education level: Not on file  Occupational History  . Not on file  Tobacco Use  . Smoking status: Never Smoker  . Smokeless tobacco: Never Used  Vaping Use  . Vaping Use: Never used  Substance and Sexual Activity  . Alcohol use: Yes    Alcohol/week: 0.0 standard drinks  . Drug use: No  . Sexual activity: Yes    Partners: Male    Birth control/protection: Post-menopausal  Other Topics Concern  . Not on file  Social History Narrative  . Not on file   Social Determinants of Health   Financial Resource Strain: Not on  file  Food Insecurity: Not on file  Transportation Needs: Not on file  Physical Activity: Not on file  Stress: Not on file  Social Connections: Not on file  Intimate Partner Violence: Not on file    Family History  Problem Relation Age of Onset  . Hypothyroidism Mother   . Dementia Mother   . Heart disease Father        CHF  . Breast cancer Sister 101  . Alzheimer's disease Sister   . Heart attack Brother 43  . Colon cancer Neg Hx     ROS   Physical Exam Vitals: BP 90/60   Ht 5\' 9"  (1.753 m)   Wt 177 lb (80.3 kg)   BMI 26.14 kg/m   OBGyn Exam   Female chaperone present for pelvic and breast   portions of the physical exam  Results: AUDIT Questionnaire (screen for alcoholism): NA    Assessment and Plan:  65 y.o. G71P0010 female here for routine annual gynecologic examination  Plan: Problem List Items Addressed This Visit   None   Visit Diagnoses    Women's annual routine gynecological examination    -  Primary   Relevant Orders   IGP,CtNg,AptimaHPV,rfx16/18,45   Cervical cancer screening       Relevant Orders   IGP,CtNg,AptimaHPV,rfx16/18,45      Screening: -- Blood pressure screen normal -- Colonoscopy - due - managed by PCP -- Mammogram - due. Patient to call [place] to arrange. She understands that it is her responsibility to arrange this. -- Weight screening: normal -- Depression screening negative (PHQ-9) -- Nutrition: normal -- cholesterol screening: per PCP -- osteoporosis screening: not due -- tobacco screening: not using -- alcohol screening: AUDIT questionnaire indicates low-risk usage. -- family history of breast cancer screening: done. not at high risk. -- no evidence of domestic violence or intimate partner violence. -- STD screening: gonorrhea/chlamydia NAAT not collected per patient request. -- pap smear collected per ASCCP guidelines -- flu vaccine per her PCP -- HPV vaccination series: not eligilbe   G2P0, CNM  09/25/2020 10:57 AM   09/25/2020 10:34 AM

## 2020-09-27 ENCOUNTER — Telehealth: Payer: Self-pay | Admitting: Internal Medicine

## 2020-09-27 ENCOUNTER — Other Ambulatory Visit: Payer: 59

## 2020-09-27 ENCOUNTER — Other Ambulatory Visit: Payer: Self-pay

## 2020-09-27 DIAGNOSIS — R52 Pain, unspecified: Secondary | ICD-10-CM

## 2020-09-27 NOTE — Telephone Encounter (Signed)
Patient woke up this morning with chills, body aches and scratchy throat. Feels some better this PM. 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 She is coming to be COVID tested this PM and scheduled for virtual visit with Dr Lorin Picket 1/13 at 2:30.

## 2020-09-27 NOTE — Telephone Encounter (Signed)
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

## 2020-09-27 NOTE — Addendum Note (Signed)
Addended by: Larry Sierras on: 09/27/2020 01:41 PM   Modules accepted: Orders

## 2020-09-28 ENCOUNTER — Encounter: Payer: Self-pay | Admitting: Internal Medicine

## 2020-09-28 ENCOUNTER — Telehealth (INDEPENDENT_AMBULATORY_CARE_PROVIDER_SITE_OTHER): Payer: 59 | Admitting: Internal Medicine

## 2020-09-28 DIAGNOSIS — E039 Hypothyroidism, unspecified: Secondary | ICD-10-CM | POA: Diagnosis not present

## 2020-09-28 DIAGNOSIS — R52 Pain, unspecified: Secondary | ICD-10-CM

## 2020-09-28 LAB — IGP,CTNG,APTIMAHPV,RFX16/18,45
Chlamydia, Nuc. Acid Amp: NEGATIVE
Gonococcus by Nucleic Acid Amp: NEGATIVE
HPV Aptima: NEGATIVE

## 2020-09-28 NOTE — Progress Notes (Signed)
Patient ID: Meagan Calhoun, female   DOB: Feb 22, 1956, 65 y.o.   MRN: [id number]   Virtual Visit via telephone Note  This visit type was conducted due to national recommendations for restrictions regarding the COVID-19 pandemic (e.g. social distancing).  This format is felt to be most appropriate for this patient at this time.  All issues noted in this document were discussed and addressed.  No physical exam was performed (except for noted visual exam findings with Video Visits).   I connected with York Grice by telephone and verified that I am speaking with the correct person using two identifiers. Location patient: home Location provider: work  Persons participating in the virtual visit: patient, provider  The limitations, risks, security and privacy concerns of performing an evaluation and management service by telephone and the availability of in person appointments have been discussed.  It has also been  discussed with the patient that there may be a patient responsible charge related to this service. The patient expressed understanding and agreed to proceed.   Reason for visit: work in appt.   HPI: Work in with concerns regarding aching and scratchy throat.  States symptoms started yesterday am.  Noticed aching and scratchy throat.  Temp 100.  Last night temp - 98.4 and 98.6 this am.  She [place] the woods.  Has felt ok other than symptoms above.  Still with some minimal aching.  No headache reported.  No sinus pressure or nasal congestion.  No chest pain or sob.  Eating and drinking ok.  Taking advil.    ROS: See pertinent positives and negatives per HPI.  Past Medical History:  Diagnosis Date  . Abnormal Pap smear of cervix   . Anxiety   . GERD (gastroesophageal reflux disease)   . H/O jaundice   . History of chicken pox   . History of esophageal stricture 2012  . Hx of colonic polyp   . Hypothyroidism     Past Surgical History:  Procedure Laterality Date  .  BREAST BIOPSY Left    neg  . CERVICAL BIOPSY  W/ LOOP ELECTRODE EXCISION  08/2013   chronic cervicitis  . CHOLECYSTECTOMY  11/2006   Dr Renda Rolls  . COLONOSCOPY  2012, 2017   normal  . ESOPHAGOGASTRODUODENOSCOPY (EGD) WITH PROPOFOL N/A 06/22/2015   Procedure: ESOPHAGOGASTRODUODENOSCOPY (EGD) WITH PROPOFOL;  Surgeon: Scot Jun, MD;  Location: [facility] ENDOSCOPY;  Service: Endoscopy;  Laterality: N/A;  . LAPAROTOMY  1988   ectopic pregnancy  . SAVORY DILATION N/A 06/22/2015   Procedure: SAVORY DILATION;  Surgeon: Scot Jun, MD;  Location: [facility] ENDOSCOPY;  Service: Endoscopy;  Laterality: N/A;  . thyroidectomy Right 2013   partial right thyroidectomy  . TUBAL LIGATION    . WISDOM TOOTH EXTRACTION      Family History  Problem Relation Age of Onset  . Hypothyroidism Mother   . Dementia Mother   . Heart disease Father        CHF  . Breast cancer Sister 63  . Alzheimer's disease Sister   . Heart attack Brother 21  . Colon cancer Neg Hx     SOCIAL HX: reviewed.    Current Outpatient Medications:  .  Biotin 10 MG CAPS, Take by mouth., Disp: , Rfl:  .  levothyroxine (SYNTHROID) 137 MCG tablet, Take 1 tablet (137 mcg total) by mouth daily before breakfast., Disp: 90 tablet, Rfl: 1 .  sertraline (ZOLOFT) 100 MG tablet, TAKE 1 TABLET(100 MG) BY MOUTH  DAILY, Disp: 30 tablet, Rfl: 2  EXAM:  VITALS per patient if applicable: 98.6  GENERAL: alert. Sounds to be in no acute distress.  Answering questions appropriately.   PSYCH/NEURO: pleasant and cooperative, no obvious depression or anxiety, speech and thought processing grossly intact  ASSESSMENT AND PLAN:  Discussed the following assessment and plan:  Problem List Items Addressed This Visit    Aching    Scratchy throat and body aching as outlined.  Taking advil.  No significant congestion and no chest pain or sob.  Desires covid testing.  Monitor symptoms.  Notify me if any change or progression of symptoms.         Hypothyroidism    Continue thyroid replacement.           I discussed the assessment and treatment plan with the patient. The patient was provided an opportunity to ask questions and all were answered. The patient agreed with the plan and demonstrated an understanding of the instructions.   The patient was advised to call back or seek an in-person evaluation if the symptoms worsen or if the condition fails to improve as anticipated.  I provided 22 minutes of non-face-to-face time during this encounter.   Dale [place], MD

## 2020-09-29 ENCOUNTER — Encounter: Payer: Self-pay | Admitting: Obstetrics

## 2020-09-29 LAB — SARS-COV-2, NAA 2 DAY TAT

## 2020-09-29 LAB — NOVEL CORONAVIRUS, NAA: SARS-CoV-2, NAA: NOT DETECTED

## 2020-10-08 ENCOUNTER — Encounter: Payer: Self-pay | Admitting: Internal Medicine

## 2020-10-08 DIAGNOSIS — R52 Pain, unspecified: Secondary | ICD-10-CM | POA: Insufficient documentation

## 2020-10-08 NOTE — Assessment & Plan Note (Signed)
Scratchy throat and body aching as outlined.  Taking advil.  No significant congestion and no chest pain or sob.  Desires covid testing.  Monitor symptoms.  Notify me if any change or progression of symptoms.

## 2020-10-08 NOTE — Assessment & Plan Note (Signed)
Continue thyroid replacement 

## 2020-10-10 ENCOUNTER — Encounter: Payer: 59 | Admitting: Internal Medicine

## 2020-10-17 ENCOUNTER — Other Ambulatory Visit: Payer: Self-pay

## 2020-10-17 ENCOUNTER — Ambulatory Visit
Admission: RE | Admit: 2020-10-17 | Discharge: 2020-10-17 | Disposition: A | Discharge status: Home / Self Care | Payer: 59 | Source: Ambulatory Visit | Attending: Internal Medicine | Admitting: Internal Medicine

## 2020-10-17 DIAGNOSIS — Z1231 Encounter for screening mammogram for malignant neoplasm of breast: Secondary | ICD-10-CM | POA: Diagnosis present

## 2020-10-21 ENCOUNTER — Other Ambulatory Visit: Payer: Self-pay | Admitting: Internal Medicine

## 2020-11-22 ENCOUNTER — Ambulatory Visit (INDEPENDENT_AMBULATORY_CARE_PROVIDER_SITE_OTHER): Payer: 59

## 2020-11-22 ENCOUNTER — Other Ambulatory Visit: Payer: Self-pay

## 2020-11-22 ENCOUNTER — Ambulatory Visit (INDEPENDENT_AMBULATORY_CARE_PROVIDER_SITE_OTHER): Payer: 59 | Admitting: Internal Medicine

## 2020-11-22 VITALS — BP 110/66 | HR 53 | Temp 98.3°F | Resp 16 | Ht 69.0 in | Wt 175.4 lb

## 2020-11-22 DIAGNOSIS — E039 Hypothyroidism, unspecified: Secondary | ICD-10-CM

## 2020-11-22 DIAGNOSIS — R059 Cough, unspecified: Secondary | ICD-10-CM | POA: Diagnosis not present

## 2020-11-22 DIAGNOSIS — Z13220 Encounter for screening for lipoid disorders: Secondary | ICD-10-CM

## 2020-11-22 DIAGNOSIS — R053 Chronic cough: Secondary | ICD-10-CM | POA: Insufficient documentation

## 2020-11-22 DIAGNOSIS — Z0000 Encounter for general adult medical examination without abnormal findings: Secondary | ICD-10-CM | POA: Diagnosis not present

## 2020-11-22 DIAGNOSIS — F439 Reaction to severe stress, unspecified: Secondary | ICD-10-CM

## 2020-11-22 NOTE — Progress Notes (Signed)
Patient ID: SAROYA RICCOBONO, female   DOB: [birth year], 65 y.o.   MRN: [id number]   Subjective:    Patient ID: DAYA DUTT, female    DOB: 01/22/1956, 65 y.o.   MRN: [id number]  HPI This visit occurred during the SARS-CoV-2 public health emergency.  Safety protocols were in place, including screening questions prior to the visit, additional usage of staff PPE, and extensive cleaning of exam room while observing appropriate contact time as indicated for disinfecting solutions.  Patient here for her physical exam.  Still with increased stress - her mother's health issues.  Discussed.  Taking zoloft.  Does not feel needs anything more at this time.  Stays active.  Likes working out in her yard.  No chest pain or sob with increased activity or exertion. Persistent cough.  No acid reflux reported.  No abdominal pain or bowel change reported.  No chest tightness or wheezing.  Gets her pap smears - [facility].    Past Medical History:  Diagnosis Date  . Abnormal Pap smear of cervix   . Anxiety   . GERD (gastroesophageal reflux disease)   . H/O jaundice   . History of chicken pox   . History of esophageal stricture 2012  . Hx of colonic polyp   . Hypothyroidism    Past Surgical History:  Procedure Laterality Date  . BREAST BIOPSY Left    neg  . CERVICAL BIOPSY  W/ LOOP ELECTRODE EXCISION  08/2013   chronic cervicitis  . CHOLECYSTECTOMY  11/2006   Dr Renda Rolls  . COLONOSCOPY  2012, 2017   normal  . ESOPHAGOGASTRODUODENOSCOPY (EGD) WITH PROPOFOL N/A 06/22/2015   Procedure: ESOPHAGOGASTRODUODENOSCOPY (EGD) WITH PROPOFOL;  Surgeon: Scot Jun, MD;  Location: [facility] ENDOSCOPY;  Service: Endoscopy;  Laterality: N/A;  . LAPAROTOMY  1988   ectopic pregnancy  . SAVORY DILATION N/A 06/22/2015   Procedure: SAVORY DILATION;  Surgeon: Scot Jun, MD;  Location: [facility] ENDOSCOPY;  Service: Endoscopy;  Laterality: N/A;  . thyroidectomy Right 2013   partial right thyroidectomy  . TUBAL  LIGATION    . WISDOM TOOTH EXTRACTION     Family History  Problem Relation Age of Onset  . Hypothyroidism Mother   . Dementia Mother   . Heart disease Father        CHF  . Breast cancer Sister 1  . Alzheimer's disease Sister   . Heart attack Brother 41  . Colon cancer Neg Hx    Social History   Socioeconomic History  . Marital status: Married    Spouse name: Not on file  . Number of children: 0  . Years of education: 2  . Highest education level: Not on file  Occupational History  . Not on file  Tobacco Use  . Smoking status: Never Smoker  . Smokeless tobacco: Never Used  Vaping Use  . Vaping Use: Never used  Substance and Sexual Activity  . Alcohol use: Yes    Alcohol/week: 0.0 standard drinks  . Drug use: No  . Sexual activity: Yes    Partners: Male    Birth control/protection: Post-menopausal  Other Topics Concern  . Not on file  Social History Narrative  . Not on file   Social Determinants of Health   Financial Resource Strain: Not on file  Food Insecurity: Not on file  Transportation Needs: Not on file  Physical Activity: Not on file  Stress: Not on file  Social Connections: Not on file    Outpatient  Encounter Medications as of 11/22/2020  Medication Sig  . Biotin 10 MG CAPS Take by mouth.  . sertraline (ZOLOFT) 100 MG tablet TAKE 1 TABLET(100 MG) BY MOUTH DAILY  . [DISCONTINUED] levothyroxine (SYNTHROID) 137 MCG tablet Take 1 tablet (137 mcg total) by mouth daily before breakfast.   No facility-administered encounter medications on file as of 11/22/2020.    Review of Systems  Constitutional: Negative for appetite change and unexpected weight change.  HENT: Negative for congestion, sinus pressure and sore throat.   Eyes: Negative for pain and visual disturbance.  Respiratory: Negative for cough, chest tightness and shortness of breath.   Cardiovascular: Negative for chest pain, palpitations and leg swelling.  Gastrointestinal: Negative for  abdominal pain, diarrhea, nausea and vomiting.  Genitourinary: Negative for difficulty urinating and dysuria.  Musculoskeletal: Negative for joint swelling and myalgias.  Skin: Negative for color change and rash.  Neurological: Negative for dizziness, light-headedness and headaches.  Hematological: Negative for adenopathy. Does not bruise/bleed easily.  Psychiatric/Behavioral: Negative for agitation and dysphoric mood.       Objective:    Physical Exam Vitals reviewed.  Constitutional:      General: She is not in acute distress.    Appearance: Normal appearance. She is well-developed.  HENT:     Head: Normocephalic and atraumatic.     Right Ear: External ear normal.     Left Ear: External ear normal.  Eyes:     General: No scleral icterus.       Right eye: No discharge.        Left eye: No discharge.     Conjunctiva/sclera: Conjunctivae normal.  Neck:     Thyroid: No thyromegaly.  Cardiovascular:     Rate and Rhythm: Normal rate and regular rhythm.  Pulmonary:     Effort: No tachypnea, accessory muscle usage or respiratory distress.     Breath sounds: Normal breath sounds. No decreased breath sounds or wheezing.  Chest:  Breasts:     Right: No inverted nipple, mass, nipple discharge or tenderness (no axillary adenopathy).     Left: No inverted nipple, mass, nipple discharge or tenderness (no axilarry adenopathy).    Abdominal:     General: Bowel sounds are normal.     Palpations: Abdomen is soft.     Tenderness: There is no abdominal tenderness.  Musculoskeletal:        General: No swelling or tenderness.     Cervical back: Neck supple. No tenderness.  Lymphadenopathy:     Cervical: No cervical adenopathy.  Skin:    Findings: No erythema or rash.  Neurological:     Mental Status: She is alert and oriented to person, place, and time.  Psychiatric:        Mood and Affect: Mood normal.        Behavior: Behavior normal.     BP 110/66   Pulse (!) 53   Temp 98.3  F (36.8 C) (Oral)   Resp 16   Ht 5\' 9"  (1.753 m)   Wt 175 lb 6.4 oz (79.6 kg)   SpO2 97%   BMI 25.90 kg/m  Wt Readings from Last 3 Encounters:  11/22/20 175 lb 6.4 oz (79.6 kg)  09/28/20 177 lb (80.3 kg)  09/25/20 177 lb (80.3 kg)     Lab Results  Component Value Date   WBC 4.4 11/22/2020   HGB 13.4 11/22/2020   HCT 40.5 11/22/2020   PLT 256 11/22/2020   GLUCOSE 98 11/22/2020  CHOL 200 (H) 11/22/2020   TRIG 91 11/22/2020   HDL 42 11/22/2020   LDLCALC 141 (H) 11/22/2020   ALT 21 11/22/2020   AST 19 11/22/2020   NA 138 11/22/2020   K 4.5 11/22/2020   CL 103 11/22/2020   CREATININE 0.95 11/22/2020   BUN 16 11/22/2020   CO2 21 11/22/2020   TSH 0.231 (L) 11/22/2020    MM 3D SCREEN BREAST BILATERAL  Result Date: 10/17/2020 CLINICAL DATA:  Screening. EXAM: DIGITAL SCREENING BILATERAL MAMMOGRAM WITH TOMO AND CAD COMPARISON:  Previous exam(s). ACR Breast Density Category b: There are scattered areas of fibroglandular density. FINDINGS: There are no findings suspicious for malignancy. The images were evaluated with computer-aided detection. IMPRESSION: No mammographic evidence of malignancy. A result letter of this screening mammogram will be mailed directly to the patient. RECOMMENDATION: Screening mammogram in one year. (Code:SM-B-01Y) BI-RADS CATEGORY  1: Negative. Electronically Signed   By: Beckie Salts M.D.   On: 10/17/2020 10:52       Assessment & Plan:   Problem List Items Addressed This Visit    Cough    Persistent cough.  Check cxr.  No acid reflux reported.  No increased congestion.        Relevant Orders   DG Chest 2 View (Completed)   Health care maintenance    Physical 11/21/20.  Sees gyn for pelvic and pap smears.  Mammogram 10/17/20 - birads I.  Colonoscopy 07/2016.  Recommended f/u in 5 years.        Hypothyroidism - Primary    On thyroid replacement.  Check tsh today.       Relevant Orders   CBC with Differential/Platelet (Completed)   Comprehensive  metabolic panel (Completed)   TSH (Completed)   Stress    Increased stress as outlined. On zoloft.  Feels is helping.  Also helps to get outside and work.  Has good support.  Does not feel needs any further intervention at this time.  Follow.        Other Visit Diagnoses    Screening cholesterol level       Relevant Orders   Lipid panel (Completed)       Dale [place], MD

## 2020-11-22 NOTE — Assessment & Plan Note (Signed)
Physical 11/21/20.  Sees gyn for pelvic and pap smears.  Mammogram 10/17/20 - birads I.  Colonoscopy 07/2016.  Recommended f/u in 5 years.

## 2020-11-23 LAB — LIPID PANEL
Chol/HDL Ratio: 4.8 ratio — ABNORMAL HIGH (ref 0.0–4.4)
Cholesterol, Total: 200 mg/dL — ABNORMAL HIGH (ref 100–199)
HDL: 42 mg/dL (ref >39)
LDL Chol Calc (NIH): 141 mg/dL — ABNORMAL HIGH (ref 0–99)
Triglycerides: 91 mg/dL (ref 0–149)
VLDL Cholesterol Cal: 17 mg/dL (ref 5–40)

## 2020-11-23 LAB — CBC WITH DIFFERENTIAL/PLATELET
Basophils Absolute: 0 10*3/uL (ref 0.0–0.2)
Basos: 1 %
EOS (ABSOLUTE): 0.1 10*3/uL (ref 0.0–0.4)
Eos: 3 %
Hematocrit: 40.5 % (ref 34.0–46.6)
Hemoglobin: 13.4 g/dL (ref 11.1–15.9)
Immature Grans (Abs): 0 10*3/uL (ref 0.0–0.1)
Immature Granulocytes: 0 %
Lymphocytes Absolute: 1.4 10*3/uL (ref 0.7–3.1)
Lymphs: 33 %
MCH: 28.6 pg (ref 26.6–33.0)
MCHC: 33.1 g/dL (ref 31.5–35.7)
MCV: 87 fL (ref 79–97)
Monocytes Absolute: 0.4 10*3/uL (ref 0.1–0.9)
Monocytes: 9 %
Neutrophils Absolute: 2.4 10*3/uL (ref 1.4–7.0)
Neutrophils: 54 %
Platelets: 256 10*3/uL (ref 150–450)
RBC: 4.68 x10E6/uL (ref 3.77–5.28)
RDW: 13.4 % (ref 11.7–15.4)
WBC: 4.4 10*3/uL (ref 3.4–10.8)

## 2020-11-23 LAB — COMPREHENSIVE METABOLIC PANEL
ALT: 21 IU/L (ref 0–32)
AST: 19 IU/L (ref 0–40)
Albumin/Globulin Ratio: 1.7 (ref 1.2–2.2)
Albumin: 4.7 g/dL (ref 3.8–4.8)
Alkaline Phosphatase: 53 IU/L (ref 44–121)
BUN/Creatinine Ratio: 17 (ref 12–28)
BUN: 16 mg/dL (ref 8–27)
Bilirubin Total: 0.5 mg/dL (ref 0.0–1.2)
CO2: 21 mmol/L (ref 20–29)
Calcium: 9.6 mg/dL (ref 8.7–10.3)
Chloride: 103 mmol/L (ref 96–106)
Creatinine, Ser: 0.95 mg/dL (ref 0.57–1.00)
Globulin, Total: 2.8 g/dL (ref 1.5–4.5)
Glucose: 98 mg/dL (ref 65–99)
Potassium: 4.5 mmol/L (ref 3.5–5.2)
Sodium: 138 mmol/L (ref 134–144)
Total Protein: 7.5 g/dL (ref 6.0–8.5)
eGFR: 67 mL/min/{1.73_m2} (ref >59)

## 2020-11-23 LAB — TSH: TSH: 0.231 u[IU]/mL — ABNORMAL LOW (ref 0.450–4.500)

## 2020-11-24 ENCOUNTER — Other Ambulatory Visit: Payer: Self-pay

## 2020-11-24 ENCOUNTER — Other Ambulatory Visit: Payer: Self-pay | Admitting: Internal Medicine

## 2020-11-24 DIAGNOSIS — E039 Hypothyroidism, unspecified: Secondary | ICD-10-CM

## 2020-11-24 MED ORDER — LEVOTHYROXINE SODIUM 125 MCG PO TABS: 125.0000 ug | ORAL_TABLET | Freq: Every day | ORAL | 1 refills | Status: DC

## 2020-11-25 ENCOUNTER — Other Ambulatory Visit: Payer: Self-pay | Admitting: Internal Medicine

## 2020-11-25 DIAGNOSIS — R053 Chronic cough: Secondary | ICD-10-CM

## 2020-11-25 NOTE — Progress Notes (Signed)
Order placed for pulmonary referral.  

## 2020-12-03 ENCOUNTER — Encounter: Payer: Self-pay | Admitting: Internal Medicine

## 2020-12-03 NOTE — Assessment & Plan Note (Signed)
Persistent cough.  Check cxr.  No acid reflux reported.  No increased congestion.

## 2020-12-03 NOTE — Assessment & Plan Note (Signed)
Increased stress as outlined. On zoloft.  Feels is helping.  Also helps to get outside and work.  Has good support.  Does not feel needs any further intervention at this time.  Follow.

## 2020-12-03 NOTE — Assessment & Plan Note (Signed)
On thyroid replacement.  Check tsh today.   

## 2020-12-24 ENCOUNTER — Other Ambulatory Visit: Payer: Self-pay | Admitting: Internal Medicine

## 2021-01-09 LAB — TSH: TSH: 1.39 u[IU]/mL (ref 0.450–4.500)

## 2021-01-15 ENCOUNTER — Institutional Professional Consult (permissible substitution): Payer: 59 | Admitting: Pulmonary Disease

## 2021-01-19 ENCOUNTER — Other Ambulatory Visit: Payer: Self-pay | Admitting: Internal Medicine

## 2021-03-23 ENCOUNTER — Other Ambulatory Visit: Payer: Self-pay | Admitting: Internal Medicine

## 2021-04-26 LAB — HEMOGLOBIN A1C: Hemoglobin A1C: 5.5

## 2021-04-26 LAB — LIPID PANEL
Cholesterol: 171 (ref 0–200)
HDL: 39 (ref 35–70)
LDL Cholesterol: 117
Triglycerides: 81 (ref 40–160)

## 2021-04-26 LAB — BASIC METABOLIC PANEL
Creatinine: 1 (ref 0.5–1.1)
Glucose: 90

## 2021-04-26 LAB — COMPREHENSIVE METABOLIC PANEL: GFR calc non Af Amer: 66

## 2021-05-25 ENCOUNTER — Other Ambulatory Visit: Payer: Self-pay | Admitting: Internal Medicine

## 2021-05-25 ENCOUNTER — Encounter: Payer: Self-pay | Admitting: Internal Medicine

## 2021-05-25 ENCOUNTER — Other Ambulatory Visit: Payer: Self-pay

## 2021-05-25 ENCOUNTER — Ambulatory Visit: Payer: 59 | Admitting: Internal Medicine

## 2021-05-25 VITALS — BP 118/70 | HR 80 | Temp 97.4°F | Resp 16 | Ht 69.0 in | Wt 171.0 lb

## 2021-05-25 DIAGNOSIS — F439 Reaction to severe stress, unspecified: Secondary | ICD-10-CM

## 2021-05-25 DIAGNOSIS — Z86010 Personal history of colonic polyps: Secondary | ICD-10-CM | POA: Diagnosis not present

## 2021-05-25 DIAGNOSIS — Z1159 Encounter for screening for other viral diseases: Secondary | ICD-10-CM

## 2021-05-25 DIAGNOSIS — E039 Hypothyroidism, unspecified: Secondary | ICD-10-CM

## 2021-05-25 DIAGNOSIS — Z114 Encounter for screening for human immunodeficiency virus [HIV]: Secondary | ICD-10-CM

## 2021-05-25 DIAGNOSIS — R200 Anesthesia of skin: Secondary | ICD-10-CM

## 2021-05-25 DIAGNOSIS — R87619 Unspecified abnormal cytological findings in specimens from cervix uteri: Secondary | ICD-10-CM

## 2021-05-25 NOTE — Progress Notes (Signed)
Patient ID: Meagan Calhoun, female   DOB: 06/22/1956, 65 y.o.   MRN: [id number]   Subjective:    Patient ID: Meagan Calhoun, female    DOB: 1956-05-04, 65 y.o.   MRN: [id number]  This visit occurred during the SARS-CoV-2 public health emergency.  Safety protocols were in place, including screening questions prior to the visit, additional usage of staff PPE, and extensive cleaning of exam room while observing appropriate contact time as indicated for disinfecting solutions.   Patient here for scheduled follow up.   Chief Complaint  Patient presents with   Numbness    Bilateral hand numbness and tingling   .   HPI Here also to follow up on her thyroid and increased stress.  Doing better.  Getting outside more.  Working outside.  Helping her deal with the stress.  No chest pain or sob reported.  No abdominal pain or bowel change reported.  She is having numbness and tingling - bilateral hands - right > left.  Uses her hands a lot.  Worse at night.  Wakes up numb.  Better - after up and moving around.  No acid reflux reported.  No abdominal pain.  Bowles moving.     Past Medical History:  Diagnosis Date   Abnormal Pap smear of cervix    Anxiety    GERD (gastroesophageal reflux disease)    H/O jaundice    History of chicken pox    History of esophageal stricture 2012   Hx of colonic polyp    Hypothyroidism    Past Surgical History:  Procedure Laterality Date   BREAST BIOPSY Left    neg   CERVICAL BIOPSY  W/ LOOP ELECTRODE EXCISION  08/2013   chronic cervicitis   CHOLECYSTECTOMY  11/2006   Dr Renda Rolls   COLONOSCOPY  2012, 2017   normal   ESOPHAGOGASTRODUODENOSCOPY (EGD) WITH PROPOFOL N/A 06/22/2015   Procedure: ESOPHAGOGASTRODUODENOSCOPY (EGD) WITH PROPOFOL;  Surgeon: Scot Jun, MD;  Location: [facility] ENDOSCOPY;  Service: Endoscopy;  Laterality: N/A;   LAPAROTOMY  1988   ectopic pregnancy   SAVORY DILATION N/A 06/22/2015   Procedure: SAVORY DILATION;  Surgeon:  Scot Jun, MD;  Location: [facility] ENDOSCOPY;  Service: Endoscopy;  Laterality: N/A;   thyroidectomy Right 2013   partial right thyroidectomy   TUBAL LIGATION     WISDOM TOOTH EXTRACTION     Family History  Problem Relation Age of Onset   Hypothyroidism Mother    Dementia Mother    Heart disease Father        CHF   Breast cancer Sister 45   Alzheimer's disease Sister    Heart attack Brother 4   Colon cancer Neg Hx    Social History   Socioeconomic History   Marital status: Married    Spouse name: Not on file   Number of children: 0   Years of education: 12   Highest education level: Not on file  Occupational History   Not on file  Tobacco Use   Smoking status: Never   Smokeless tobacco: Never  Vaping Use   Vaping Use: Never used  Substance and Sexual Activity   Alcohol use: Yes    Alcohol/week: 0.0 standard drinks   Drug use: No   Sexual activity: Yes    Partners: Male    Birth control/protection: Post-menopausal  Other Topics Concern   Not on file  Social History Narrative   Not on file   Social Determinants of Health  Financial Resource Strain: Not on file  Food Insecurity: Not on file  Transportation Needs: Not on file  Physical Activity: Not on file  Stress: Not on file  Social Connections: Not on file    Review of Systems  Constitutional:  Negative for appetite change and unexpected weight change.  HENT:  Negative for congestion and sinus pressure.   Respiratory:  Negative for cough, chest tightness and shortness of breath.   Cardiovascular:  Negative for chest pain, palpitations and leg swelling.  Gastrointestinal:  Negative for abdominal pain, diarrhea, nausea and vomiting.  Genitourinary:  Negative for difficulty urinating and dysuria.  Musculoskeletal:  Negative for joint swelling and myalgias.  Skin:  Negative for color change and rash.  Neurological:  Negative for dizziness, light-headedness and headaches.  Psychiatric/Behavioral:   Negative for agitation and dysphoric mood.       Objective:     BP 118/70   Pulse 80   Temp (!) 97.4 F (36.3 C)   Resp 16   Ht 5\' 9"  (1.753 m)   Wt 171 lb (77.6 kg)   SpO2 98%   BMI 25.25 kg/m  Wt Readings from Last 3 Encounters:  05/25/21 171 lb (77.6 kg)  11/22/20 175 lb 6.4 oz (79.6 kg)  09/28/20 177 lb (80.3 kg)    Physical Exam Vitals reviewed.  Constitutional:      General: She is not in acute distress.    Appearance: Normal appearance.  HENT:     Head: Normocephalic and atraumatic.     Right Ear: External ear normal.     Left Ear: External ear normal.  Eyes:     General: No scleral icterus.       Right eye: No discharge.        Left eye: No discharge.     Conjunctiva/sclera: Conjunctivae normal.  Neck:     Thyroid: No thyromegaly.  Cardiovascular:     Rate and Rhythm: Normal rate and regular rhythm.  Pulmonary:     Effort: No respiratory distress.     Breath sounds: Normal breath sounds. No wheezing.  Abdominal:     General: Bowel sounds are normal.     Palpations: Abdomen is soft.     Tenderness: There is no abdominal tenderness.  Musculoskeletal:        General: No swelling or tenderness.     Cervical back: Neck supple. No tenderness.  Lymphadenopathy:     Cervical: No cervical adenopathy.  Skin:    Findings: No erythema or rash.  Neurological:     Mental Status: She is alert.  Psychiatric:        Mood and Affect: Mood normal.        Behavior: Behavior normal.     Outpatient Encounter Medications as of 05/25/2021  Medication Sig   Biotin 10 MG CAPS Take by mouth.   levothyroxine (SYNTHROID) 125 MCG tablet TAKE 1 TABLET(125 MCG) BY MOUTH DAILY BEFORE BREAKFAST   sertraline (ZOLOFT) 100 MG tablet TAKE 1 TABLET(100 MG) BY MOUTH DAILY   No facility-administered encounter medications on file as of 05/25/2021.     Lab Results  Component Value Date   WBC 4.4 11/22/2020   HGB 13.4 11/22/2020   HCT 40.5 11/22/2020   PLT 256 11/22/2020    GLUCOSE 98 11/22/2020   CHOL 200 (H) 11/22/2020   TRIG 91 11/22/2020   HDL 42 11/22/2020   LDLCALC 141 (H) 11/22/2020   ALT 21 11/22/2020   AST 19 11/22/2020  NA 138 11/22/2020   K 4.5 11/22/2020   CL 103 11/22/2020   CREATININE 0.95 11/22/2020   BUN 16 11/22/2020   CO2 21 11/22/2020   TSH 0.977 05/25/2021    MM 3D SCREEN BREAST BILATERAL  Result Date: 10/17/2020 CLINICAL DATA:  Screening. EXAM: DIGITAL SCREENING BILATERAL MAMMOGRAM WITH TOMO AND CAD COMPARISON:  Previous exam(s). ACR Breast Density Category b: There are scattered areas of fibroglandular density. FINDINGS: There are no findings suspicious for malignancy. The images were evaluated with computer-aided detection. IMPRESSION: No mammographic evidence of malignancy. A result letter of this screening mammogram will be mailed directly to the patient. RECOMMENDATION: Screening mammogram in one year. (Code:SM-B-01Y) BI-RADS CATEGORY  1: Negative. Electronically Signed   By: Beckie Salts M.D.   On: 10/17/2020 10:52       Assessment & Plan:   Problem List Items Addressed This Visit     Abnormal Pap smear of cervix    PAP 09/2020 - ok.        Bilateral hand numbness    Right > left.  Appears to be c/w CTS.  Cock up wrist splints.  Follow.  Call with update.       History of colonic polyps    Colonoscopy 07/2016.        Hypothyroidism    On thyroid replacement.  Check tsh today.       Relevant Orders   TSH   Stress    Overall doing better.  Remains on zoloft.  Getting outside more.  Working outside.  This is helping.  Follow.  No changes.        Other Visit Diagnoses     Encounter for hepatitis C screening test for low risk patient    -  Primary   Relevant Orders   Hepatitis C antibody   Screening for HIV without presence of risk factors       Relevant Orders   HIV antibody (with reflex)        Dale Weldona, MD

## 2021-05-25 NOTE — Patient Instructions (Signed)
Cock up wrist splints

## 2021-05-26 ENCOUNTER — Encounter: Payer: Self-pay | Admitting: Internal Medicine

## 2021-05-26 DIAGNOSIS — R200 Anesthesia of skin: Secondary | ICD-10-CM | POA: Insufficient documentation

## 2021-05-26 LAB — TSH: TSH: 0.977 u[IU]/mL (ref 0.450–4.500)

## 2021-05-26 LAB — HEPATITIS C ANTIBODY: Hep C Virus Ab: <0.1 s/co ratio (ref 0.0–0.9)

## 2021-05-26 LAB — HIV ANTIBODY (ROUTINE TESTING W REFLEX): HIV Screen 4th Generation wRfx: NONREACTIVE

## 2021-05-26 NOTE — Assessment & Plan Note (Signed)
PAP 09/2020 - ok.   

## 2021-05-26 NOTE — Assessment & Plan Note (Signed)
Overall doing better.  Remains on zoloft.  Getting outside more.  Working outside.  This is helping.  Follow.  No changes.

## 2021-05-26 NOTE — Assessment & Plan Note (Signed)
Colonoscopy 07/2016

## 2021-05-26 NOTE — Assessment & Plan Note (Signed)
Right > left.  Appears to be c/w CTS.  Cock up wrist splints.  Follow.  Call with update.

## 2021-05-26 NOTE — Assessment & Plan Note (Signed)
On thyroid replacement.  Check tsh today.   

## 2021-06-12 ENCOUNTER — Encounter: Payer: Self-pay | Admitting: Internal Medicine

## 2021-06-12 DIAGNOSIS — Z1211 Encounter for screening for malignant neoplasm of colon: Secondary | ICD-10-CM

## 2021-06-12 DIAGNOSIS — R200 Anesthesia of skin: Secondary | ICD-10-CM

## 2021-06-12 NOTE — Telephone Encounter (Signed)
Order placed for referral for NCS

## 2021-06-13 NOTE — Telephone Encounter (Signed)
Dr Markham Jordan has retired.  That is who performed her last colonoscopy.  Who does she prefer to see.  Ok for referral.

## 2021-06-15 ENCOUNTER — Encounter: Payer: Self-pay | Admitting: Internal Medicine

## 2021-06-15 NOTE — Telephone Encounter (Signed)
Order placed for GI referral.   

## 2021-06-27 NOTE — Telephone Encounter (Signed)
My chart message sent to Trish.  The order for the GI referral has been placed.  She was questioning about this being scheduled.  Do you mind giving her an update.  Thank you

## 2021-06-27 NOTE — Telephone Encounter (Signed)
Thank you :)

## 2021-06-29 ENCOUNTER — Other Ambulatory Visit: Payer: Self-pay | Admitting: Internal Medicine

## 2021-07-09 ENCOUNTER — Encounter: Payer: Self-pay | Admitting: Internal Medicine

## 2021-07-26 ENCOUNTER — Other Ambulatory Visit: Payer: Self-pay | Admitting: Internal Medicine

## 2021-09-24 ENCOUNTER — Other Ambulatory Visit: Payer: Self-pay | Admitting: Internal Medicine

## 2021-09-24 DIAGNOSIS — Z1231 Encounter for screening mammogram for malignant neoplasm of breast: Secondary | ICD-10-CM

## 2021-09-25 ENCOUNTER — Ambulatory Visit: Payer: 59 | Admitting: Internal Medicine

## 2021-09-25 ENCOUNTER — Encounter: Payer: Self-pay | Admitting: Internal Medicine

## 2021-09-25 ENCOUNTER — Other Ambulatory Visit: Payer: Self-pay

## 2021-09-25 VITALS — BP 110/68 | HR 75 | Temp 97.9°F | Resp 16 | Ht 69.0 in | Wt 175.0 lb

## 2021-09-25 DIAGNOSIS — F439 Reaction to severe stress, unspecified: Secondary | ICD-10-CM

## 2021-09-25 DIAGNOSIS — Z13220 Encounter for screening for lipoid disorders: Secondary | ICD-10-CM | POA: Diagnosis not present

## 2021-09-25 DIAGNOSIS — E039 Hypothyroidism, unspecified: Secondary | ICD-10-CM

## 2021-09-25 DIAGNOSIS — Z803 Family history of malignant neoplasm of breast: Secondary | ICD-10-CM

## 2021-09-25 DIAGNOSIS — R200 Anesthesia of skin: Secondary | ICD-10-CM | POA: Diagnosis not present

## 2021-09-25 DIAGNOSIS — Z860100 Personal history of colon polyps, unspecified: Secondary | ICD-10-CM

## 2021-09-25 NOTE — Progress Notes (Signed)
Patient ID: Meagan Calhoun, female   DOB: 04/13/1956, 66 y.o.   MRN: [id number]   Subjective:    Patient ID: Meagan Calhoun, female    DOB: [birth year], 66 y.o.   MRN: [id number]  This visit occurred during the SARS-CoV-2 public health emergency.  Safety protocols were in place, including screening questions prior to the visit, additional usage of staff PPE, and extensive cleaning of exam room while observing appropriate contact time as indicated for disinfecting solutions.   Patient here for a scheduled follow up.   Chief Complaint  Patient presents with   Hypothyroidism   Depression   .   HPI Here to follow up regarding increased stress and her thyroid.  Last visit was having issues with bilateral hand numbness.  Was referred to neurology for evaluation and question of NCS.  She stays active.  No chest pain or sob reported.  No abdominal pain or bowel change reported.  Per review, last colonoscopy 07/2016.  Recommended f/u colonoscopy 5 years.  Scheduled for end of month.  Mother recently passed away.  She has good support.  Does not feel needs any further intervention at this time.  Aware counselors available if needed.     Past Medical History:  Diagnosis Date   Abnormal Pap smear of cervix    Anxiety    GERD (gastroesophageal reflux disease)    H/O jaundice    History of chicken pox    History of esophageal stricture 2012   Hx of colonic polyp    Hypothyroidism    Past Surgical History:  Procedure Laterality Date   BREAST BIOPSY Left    neg   CERVICAL BIOPSY  W/ LOOP ELECTRODE EXCISION  08/2013   chronic cervicitis   CHOLECYSTECTOMY  11/2006   Dr Renda Rolls   COLONOSCOPY  2012, 2017   normal   ESOPHAGOGASTRODUODENOSCOPY (EGD) WITH PROPOFOL N/A 06/22/2015   Procedure: ESOPHAGOGASTRODUODENOSCOPY (EGD) WITH PROPOFOL;  Surgeon: Scot Jun, MD;  Location: [facility] ENDOSCOPY;  Service: Endoscopy;  Laterality: N/A;   LAPAROTOMY  1988   ectopic pregnancy   SAVORY  DILATION N/A 06/22/2015   Procedure: SAVORY DILATION;  Surgeon: Scot Jun, MD;  Location: [facility] ENDOSCOPY;  Service: Endoscopy;  Laterality: N/A;   thyroidectomy Right 2013   partial right thyroidectomy   TUBAL LIGATION     WISDOM TOOTH EXTRACTION     Family History  Problem Relation Age of Onset   Hypothyroidism Mother    Dementia Mother    Heart disease Father        CHF   Breast cancer Sister 31   Alzheimer's disease Sister    Heart attack Brother 4   Colon cancer Neg Hx    Social History   Socioeconomic History   Marital status: Married    Spouse name: Not on file   Number of children: 0   Years of education: 12   Highest education level: Not on file  Occupational History   Not on file  Tobacco Use   Smoking status: Never   Smokeless tobacco: Never  Vaping Use   Vaping Use: Never used  Substance and Sexual Activity   Alcohol use: Yes    Alcohol/week: 0.0 standard drinks   Drug use: No   Sexual activity: Yes    Partners: Male    Birth control/protection: Post-menopausal  Other Topics Concern   Not on file  Social History Narrative   Not on file   Social Determinants of Health  Financial Resource Strain: Not on file  Food Insecurity: Not on file  Transportation Needs: Not on file  Physical Activity: Not on file  Stress: Not on file  Social Connections: Not on file     Review of Systems  Constitutional:  Negative for appetite change and unexpected weight change.  HENT:  Negative for congestion and sinus pressure.   Respiratory:  Negative for cough, chest tightness and shortness of breath.   Cardiovascular:  Negative for chest pain, palpitations and leg swelling.  Gastrointestinal:  Negative for abdominal pain, diarrhea, nausea and vomiting.  Genitourinary:  Negative for difficulty urinating and dysuria.  Musculoskeletal:  Negative for joint swelling and myalgias.  Skin:  Negative for color change and rash.  Neurological:  Negative for  dizziness, light-headedness and headaches.       Hand numbness as outlined.  Right - working.  Left - driving.   Psychiatric/Behavioral:  Negative for agitation.        Increased stress as outlined.       Objective:     BP 110/68    Pulse 75    Temp 97.9 F (36.6 C)    Resp 16    Ht 5\' 9"  (1.753 m)    Wt 175 lb (79.4 kg)    SpO2 98%    BMI 25.84 kg/m  Wt Readings from Last 3 Encounters:  09/25/21 175 lb (79.4 kg)  05/25/21 171 lb (77.6 kg)  11/22/20 175 lb 6.4 oz (79.6 kg)    Physical Exam Vitals reviewed.  Constitutional:      General: She is not in acute distress.    Appearance: Normal appearance.  HENT:     Head: Normocephalic and atraumatic.     Right Ear: External ear normal.     Left Ear: External ear normal.  Eyes:     General: No scleral icterus.       Right eye: No discharge.        Left eye: No discharge.     Conjunctiva/sclera: Conjunctivae normal.  Neck:     Thyroid: No thyromegaly.  Cardiovascular:     Rate and Rhythm: Normal rate and regular rhythm.  Pulmonary:     Effort: No respiratory distress.     Breath sounds: Normal breath sounds. No wheezing.  Abdominal:     General: Bowel sounds are normal.     Palpations: Abdomen is soft.     Tenderness: There is no abdominal tenderness.  Musculoskeletal:        General: No swelling or tenderness.     Cervical back: Neck supple. No tenderness.  Lymphadenopathy:     Cervical: No cervical adenopathy.  Skin:    Findings: No erythema or rash.  Neurological:     Mental Status: She is alert.  Psychiatric:        Mood and Affect: Mood normal.        Behavior: Behavior normal.     Outpatient Encounter Medications as of 09/25/2021  Medication Sig   Biotin 10 MG CAPS Take by mouth.   levothyroxine (SYNTHROID) 125 MCG tablet TAKE 1 TABLET(125 MCG) BY MOUTH DAILY BEFORE BREAKFAST   sertraline (ZOLOFT) 100 MG tablet TAKE 1 TABLET(100 MG) BY MOUTH DAILY   No facility-administered encounter medications on file  as of 09/25/2021.     Lab Results  Component Value Date   WBC 4.3 09/25/2021   HGB 14.1 09/25/2021   HCT 41.3 09/25/2021   PLT 242 09/25/2021   GLUCOSE 98 09/25/2021  CHOL 213 (H) 09/25/2021   TRIG 123 09/25/2021   HDL 42 09/25/2021   LDLCALC 149 (H) 09/25/2021   ALT 14 09/25/2021   AST 16 09/25/2021   NA 140 09/25/2021   K 4.6 09/25/2021   CL 103 09/25/2021   CREATININE 1.10 (H) 09/25/2021   BUN 16 09/25/2021   CO2 24 09/25/2021   TSH 1.850 09/25/2021   HGBA1C 5.5 04/26/2021    MM 3D SCREEN BREAST BILATERAL  Result Date: 10/17/2020 CLINICAL DATA:  Screening. EXAM: DIGITAL SCREENING BILATERAL MAMMOGRAM WITH TOMO AND CAD COMPARISON:  Previous exam(s). ACR Breast Density Category b: There are scattered areas of fibroglandular density. FINDINGS: There are no findings suspicious for malignancy. The images were evaluated with computer-aided detection. IMPRESSION: No mammographic evidence of malignancy. A result letter of this screening mammogram will be mailed directly to the patient. RECOMMENDATION: Screening mammogram in one year. (Code:SM-B-01Y) BI-RADS CATEGORY  1: Negative. Electronically Signed   By: Beckie SaltsSteven  Reid M.D.   On: 10/17/2020 10:52       Assessment & Plan:   Problem List Items Addressed This Visit     Bilateral hand numbness - Primary    Persistent.  Splints.  Has appt with neurology this week.       Family history of breast cancer    Mammogram scheduled for 10/2021.       History of colonic polyps    Colonoscopy 07/2016.  Recommended f/u in 5 years.  Scheduled for later this month.       Hypothyroidism    On thyroid replacement.  Check tsh today.       Relevant Orders   Hepatic function panel (Completed)   Basic metabolic panel (Completed)   CBC with Differential/Platelet (Completed)   TSH (Completed)   Stress    Increased stress as outlined.  Discussed.  Has good support.  On zoloft.  Continue.  Follow.  Does not feel needs any further intervention  at this time.       Other Visit Diagnoses     Screening cholesterol level       Relevant Orders   Lipid panel (Completed)       I spent 30minutes with the patient and more than 50% of the time was spent in consultation regarding the above.  Time spent discuss current concerns and symptoms.  Time also spent discussing further treatment and evaluation.   Dale Durhamharlene Nikholas Geffre, MD

## 2021-09-26 LAB — CBC WITH DIFFERENTIAL/PLATELET
Basophils Absolute: 0 10*3/uL (ref 0.0–0.2)
Basos: 1 %
EOS (ABSOLUTE): 0.2 10*3/uL (ref 0.0–0.4)
Eos: 4 %
Hematocrit: 41.3 % (ref 34.0–46.6)
Hemoglobin: 14.1 g/dL (ref 11.1–15.9)
Immature Grans (Abs): 0 10*3/uL (ref 0.0–0.1)
Immature Granulocytes: 0 %
Lymphocytes Absolute: 1.4 10*3/uL (ref 0.7–3.1)
Lymphs: 33 %
MCH: 29.4 pg (ref 26.6–33.0)
MCHC: 34.1 g/dL (ref 31.5–35.7)
MCV: 86 fL (ref 79–97)
Monocytes Absolute: 0.4 10*3/uL (ref 0.1–0.9)
Monocytes: 10 %
Neutrophils Absolute: 2.2 10*3/uL (ref 1.4–7.0)
Neutrophils: 52 %
Platelets: 242 10*3/uL (ref 150–450)
RBC: 4.79 x10E6/uL (ref 3.77–5.28)
RDW: 13.2 % (ref 11.7–15.4)
WBC: 4.3 10*3/uL (ref 3.4–10.8)

## 2021-09-26 LAB — HEPATIC FUNCTION PANEL
ALT: 14 IU/L (ref 0–32)
AST: 16 IU/L (ref 0–40)
Albumin: 4.5 g/dL (ref 3.8–4.8)
Alkaline Phosphatase: 56 IU/L (ref 44–121)
Bilirubin Total: 0.6 mg/dL (ref 0.0–1.2)
Bilirubin, Direct: 0.11 mg/dL (ref 0.00–0.40)
Total Protein: 7.8 g/dL (ref 6.0–8.5)

## 2021-09-26 LAB — LIPID PANEL
Chol/HDL Ratio: 5.1 ratio — ABNORMAL HIGH (ref 0.0–4.4)
Cholesterol, Total: 213 mg/dL — ABNORMAL HIGH (ref 100–199)
HDL: 42 mg/dL (ref >39)
LDL Chol Calc (NIH): 149 mg/dL — ABNORMAL HIGH (ref 0–99)
Triglycerides: 123 mg/dL (ref 0–149)
VLDL Cholesterol Cal: 22 mg/dL (ref 5–40)

## 2021-09-26 LAB — BASIC METABOLIC PANEL
BUN/Creatinine Ratio: 15 (ref 12–28)
BUN: 16 mg/dL (ref 8–27)
CO2: 24 mmol/L (ref 20–29)
Calcium: 9.6 mg/dL (ref 8.7–10.3)
Chloride: 103 mmol/L (ref 96–106)
Creatinine, Ser: 1.1 mg/dL — ABNORMAL HIGH (ref 0.57–1.00)
Glucose: 98 mg/dL (ref 70–99)
Potassium: 4.6 mmol/L (ref 3.5–5.2)
Sodium: 140 mmol/L (ref 134–144)
eGFR: 56 mL/min/{1.73_m2} — ABNORMAL LOW (ref >59)

## 2021-09-26 LAB — TSH: TSH: 1.85 u[IU]/mL (ref 0.450–4.500)

## 2021-09-27 ENCOUNTER — Telehealth: Payer: Self-pay

## 2021-09-27 DIAGNOSIS — R944 Abnormal results of kidney function studies: Secondary | ICD-10-CM

## 2021-09-27 NOTE — Telephone Encounter (Signed)
Ordered labs for [facility] for future labs

## 2021-09-30 ENCOUNTER — Encounter: Payer: Self-pay | Admitting: Internal Medicine

## 2021-09-30 ENCOUNTER — Other Ambulatory Visit: Payer: Self-pay | Admitting: Internal Medicine

## 2021-09-30 NOTE — Assessment & Plan Note (Signed)
Colonoscopy 07/2016.  Recommended f/u in 5 years.  Scheduled for later this month.

## 2021-09-30 NOTE — Assessment & Plan Note (Signed)
Increased stress as outlined.  Discussed.  Has good support.  On zoloft.  Continue.  Follow.  Does not feel needs any further intervention at this time.

## 2021-09-30 NOTE — Assessment & Plan Note (Signed)
Persistent.  Splints.  Has appt with neurology this week.

## 2021-09-30 NOTE — Assessment & Plan Note (Signed)
On thyroid replacement.  Check tsh today.   

## 2021-09-30 NOTE — Assessment & Plan Note (Signed)
Mammogram scheduled for 10/2021.
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
## 2021-10-09 LAB — BASIC METABOLIC PANEL
BUN/Creatinine Ratio: 15 (ref 12–28)
BUN: 16 mg/dL (ref 8–27)
CO2: 25 mmol/L (ref 20–29)
Calcium: 9.6 mg/dL (ref 8.7–10.3)
Chloride: 105 mmol/L (ref 96–106)
Creatinine, Ser: 1.05 mg/dL — ABNORMAL HIGH (ref 0.57–1.00)
Glucose: 92 mg/dL (ref 70–99)
Potassium: 4.8 mmol/L (ref 3.5–5.2)
Sodium: 141 mmol/L (ref 134–144)
eGFR: 59 mL/min/{1.73_m2} — ABNORMAL LOW (ref >59)

## 2021-10-09 LAB — HIV ANTIBODY (ROUTINE TESTING W REFLEX): HIV Screen 4th Generation wRfx: NONREACTIVE

## 2021-10-09 LAB — HEPATITIS C ANTIBODY: Hep C Virus Ab: <0.1 s/co ratio (ref 0.0–0.9)

## 2021-10-09 LAB — TSH: TSH: 2.14 u[IU]/mL (ref 0.450–4.500)

## 2021-10-17 LAB — SURGICAL PATHOLOGY

## 2021-10-24 ENCOUNTER — Ambulatory Visit
Admission: RE | Admit: 2021-10-24 | Discharge: 2021-10-24 | Disposition: A | Discharge status: Home / Self Care | Payer: 59 | Source: Ambulatory Visit | Attending: Internal Medicine | Admitting: Internal Medicine

## 2021-10-24 ENCOUNTER — Other Ambulatory Visit: Payer: Self-pay

## 2021-10-24 DIAGNOSIS — Z1231 Encounter for screening mammogram for malignant neoplasm of breast: Secondary | ICD-10-CM | POA: Insufficient documentation

## 2022-01-07 ENCOUNTER — Encounter: Payer: Self-pay | Admitting: Internal Medicine
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
## 2022-01-08 NOTE — Telephone Encounter (Signed)
Meagan Calhoun called.  See phone note ?

## 2022-01-29 ENCOUNTER — Other Ambulatory Visit: Payer: Self-pay | Admitting: Internal Medicine

## 2022-03-26 ENCOUNTER — Encounter: Payer: Self-pay | Admitting: Internal Medicine

## 2022-03-26 ENCOUNTER — Ambulatory Visit: Payer: 59 | Admitting: Internal Medicine

## 2022-03-26 VITALS — BP 108/72 | HR 63 | Temp 98.2°F | Resp 15 | Ht 69.0 in | Wt 174.2 lb

## 2022-03-26 DIAGNOSIS — Z13220 Encounter for screening for lipoid disorders: Secondary | ICD-10-CM | POA: Diagnosis not present

## 2022-03-26 DIAGNOSIS — E559 Vitamin D deficiency, unspecified: Secondary | ICD-10-CM

## 2022-03-26 DIAGNOSIS — E039 Hypothyroidism, unspecified: Secondary | ICD-10-CM

## 2022-03-26 DIAGNOSIS — Z803 Family history of malignant neoplasm of breast: Secondary | ICD-10-CM | POA: Diagnosis not present

## 2022-03-26 DIAGNOSIS — Z86010 Personal history of colonic polyps: Secondary | ICD-10-CM | POA: Diagnosis not present

## 2022-03-26 DIAGNOSIS — Z713 Dietary counseling and surveillance: Secondary | ICD-10-CM

## 2022-03-26 DIAGNOSIS — R739 Hyperglycemia, unspecified: Secondary | ICD-10-CM

## 2022-03-26 DIAGNOSIS — F439 Reaction to severe stress, unspecified: Secondary | ICD-10-CM

## 2022-03-26 DIAGNOSIS — G5600 Carpal tunnel syndrome, unspecified upper limb: Secondary | ICD-10-CM

## 2022-03-26 LAB — LIPID PANEL
Cholesterol: 179 mg/dL (ref 0–200)
HDL: 36.7 mg/dL — ABNORMAL LOW (ref >39.00)
LDL Cholesterol: 115 mg/dL — ABNORMAL HIGH (ref 0–99)
NonHDL: 141.97
Total CHOL/HDL Ratio: 5
Triglycerides: 137 mg/dL (ref 0.0–149.0)
VLDL: 27.4 mg/dL (ref 0.0–40.0)

## 2022-03-26 LAB — HEPATIC FUNCTION PANEL
ALT: 12 U/L (ref 0–35)
AST: 14 U/L (ref 0–37)
Albumin: 4.4 g/dL (ref 3.5–5.2)
Alkaline Phosphatase: 50 U/L (ref 39–117)
Bilirubin, Direct: 0.1 mg/dL (ref 0.0–0.3)
Total Bilirubin: 0.3 mg/dL (ref 0.2–1.2)
Total Protein: 7.4 g/dL (ref 6.0–8.3)

## 2022-03-26 LAB — BASIC METABOLIC PANEL
BUN: 16 mg/dL (ref 6–23)
CO2: 26 mEq/L (ref 19–32)
Calcium: 9.4 mg/dL (ref 8.4–10.5)
Chloride: 105 mEq/L (ref 96–112)
Creatinine, Ser: 0.98 mg/dL (ref 0.40–1.20)
GFR: 61.01 mL/min (ref >60.00)
Glucose, Bld: 74 mg/dL (ref 70–99)
Potassium: 4 mEq/L (ref 3.5–5.1)
Sodium: 139 mEq/L (ref 135–145)

## 2022-03-26 LAB — TSH: TSH: 7.29 u[IU]/mL — ABNORMAL HIGH (ref 0.35–5.50)

## 2022-03-26 MED ORDER — METFORMIN HCL ER 500 MG PO TB24
500.0000 mg | ORAL_TABLET | Freq: Every day | ORAL | 2 refills | Status: DC
Start: 2022-03-26 — End: 2022-06-21

## 2022-03-26 NOTE — Progress Notes (Unsigned)
Patient ID: Meagan Calhoun, female   DOB: 1956-03-11, 66 y.o.   MRN: [id number]   Subjective:    Patient ID: Meagan Calhoun, female    DOB: [birth year], 66 y.o.   MRN: [id number]   Patient here for a scheduled follow up .   HPI Here to follow up regarding her thyroid, stress and wanted to discuss her weight.  She is concerned regarding not being able to lose weight and some weight gain.  Stays physically active.  Discussed diet and exercise.  Discussed her recent labs through neurology.  A1c elevated.  Discussed starting metformin.  No chest pain or sob reported.  No abdominal pain or bowel change reported.  Handling stress.  Took a road trip with her niece.     Past Medical History:  Diagnosis Date   Abnormal Pap smear of cervix    Anxiety    GERD (gastroesophageal reflux disease)    H/O jaundice    History of chicken pox    History of esophageal stricture 2012   Hx of colonic polyp    Hypothyroidism    Past Surgical History:  Procedure Laterality Date   BREAST BIOPSY Left    neg   CERVICAL BIOPSY  W/ LOOP ELECTRODE EXCISION  08/2013   chronic cervicitis   CHOLECYSTECTOMY  11/2006   Dr Renda Rolls   COLONOSCOPY  2012, 2017   normal   ESOPHAGOGASTRODUODENOSCOPY (EGD) WITH PROPOFOL N/A 06/22/2015   Procedure: ESOPHAGOGASTRODUODENOSCOPY (EGD) WITH PROPOFOL;  Surgeon: Scot Jun, MD;  Location: [facility] ENDOSCOPY;  Service: Endoscopy;  Laterality: N/A;   LAPAROTOMY  1988   ectopic pregnancy   SAVORY DILATION N/A 06/22/2015   Procedure: SAVORY DILATION;  Surgeon: Scot Jun, MD;  Location: [facility] ENDOSCOPY;  Service: Endoscopy;  Laterality: N/A;   thyroidectomy Right 2013   partial right thyroidectomy   TUBAL LIGATION     WISDOM TOOTH EXTRACTION     Family History  Problem Relation Age of Onset   Hypothyroidism Mother    Dementia Mother    Heart disease Father        CHF   Breast cancer Sister 31   Alzheimer's disease Sister    Heart attack Brother 72    Colon cancer Neg Hx    Social History   Socioeconomic History   Marital status: Married    Spouse name: Not on file   Number of children: 0   Years of education: 12   Highest education level: Not on file  Occupational History   Not on file  Tobacco Use   Smoking status: Never   Smokeless tobacco: Never  Vaping Use   Vaping Use: Never used  Substance and Sexual Activity   Alcohol use: Yes    Alcohol/week: 0.0 standard drinks of alcohol   Drug use: No   Sexual activity: Yes    Partners: Male    Birth control/protection: Post-menopausal  Other Topics Concern   Not on file  Social History Narrative   Not on file   Social Determinants of Health   Financial Resource Strain: Not on file  Food Insecurity: Not on file  Transportation Needs: Not on file  Physical Activity: Insufficiently Active (08/15/2017)   Exercise Vital Sign    Days of Exercise per Week: 4 days    Minutes of Exercise per Session: 30 min  Stress: No Stress Concern Present (08/15/2017)   [employer] of Occupational Health - Occupational Stress Questionnaire    Feeling of Stress :  Not at all  Social Connections: Moderately Integrated (08/15/2017)   Social Connection and Isolation Panel [NHANES]    Frequency of Communication with Friends and Family: More than three times a week    Frequency of Social Gatherings with Friends and Family: Once a week    Attends Religious Services: More than 4 times per year    Active Member of [employer] or Organizations: No    Attends Banker Meetings: Never    Marital Status: Married     Review of Systems  Constitutional:  Negative for appetite change.       Concern regarding weight gain.  HENT:  Negative for congestion and sinus pressure.   Respiratory:  Negative for cough, chest tightness and shortness of breath.   Cardiovascular:  Negative for chest pain, palpitations and leg swelling.  Gastrointestinal:  Negative for abdominal pain, diarrhea, nausea  and vomiting.  Genitourinary:  Negative for difficulty urinating and dysuria.  Musculoskeletal:  Negative for joint swelling and myalgias.  Skin:  Negative for color change and rash.  Neurological:  Negative for dizziness, light-headedness and headaches.  Psychiatric/Behavioral:  Negative for agitation and dysphoric mood.        Objective:     BP 108/72 (BP Location: Left Arm, Patient Position: Sitting, Cuff Size: Large)   Pulse 63   Temp 98.2 F (36.8 C) (Temporal)   Resp 15   Ht 5\' 9"  (1.753 m)   Wt 174 lb 3.2 oz (79 kg)   SpO2 99%   BMI 25.72 kg/m  Wt Readings from Last 3 Encounters:  03/26/22 174 lb 3.2 oz (79 kg)  09/25/21 175 lb (79.4 kg)  05/25/21 171 lb (77.6 kg)    Physical Exam Vitals reviewed.  Constitutional:      General: She is not in acute distress.    Appearance: Normal appearance.  HENT:     Head: Normocephalic and atraumatic.     Right Ear: External ear normal.     Left Ear: External ear normal.  Eyes:     General: No scleral icterus.       Right eye: No discharge.        Left eye: No discharge.     Conjunctiva/sclera: Conjunctivae normal.  Neck:     Thyroid: No thyromegaly.  Cardiovascular:     Rate and Rhythm: Normal rate and regular rhythm.  Pulmonary:     Effort: No respiratory distress.     Breath sounds: Normal breath sounds. No wheezing.  Abdominal:     General: Bowel sounds are normal.     Palpations: Abdomen is soft.     Tenderness: There is no abdominal tenderness.  Musculoskeletal:        General: No swelling or tenderness.     Cervical back: Neck supple. No tenderness.  Lymphadenopathy:     Cervical: No cervical adenopathy.  Skin:    Findings: No erythema or rash.  Neurological:     Mental Status: She is alert.  Psychiatric:        Mood and Affect: Mood normal.        Behavior: Behavior normal.      Outpatient Encounter Medications as of 03/26/2022  Medication Sig   Alpha-Lipoic Acid 600 MG CAPS Take by mouth.    Biotin 10 MG CAPS Take by mouth.   Cyanocobalamin (VITAMIN B-12 PO) Take by mouth.   FOLIC ACID PO Take by mouth.   levothyroxine (SYNTHROID) 125 MCG tablet TAKE 1 TABLET(125 MCG) BY MOUTH  DAILY BEFORE AND BREAKFAST   sertraline (ZOLOFT) 100 MG tablet TAKE 1 TABLET(100 MG) BY MOUTH DAILY   VITAMIN D PO Take by mouth.   metFORMIN (GLUCOPHAGE-XR) 500 MG 24 hr tablet Take 1 tablet (500 mg total) by mouth daily with breakfast.   No facility-administered encounter medications on file as of 03/26/2022.     Lab Results  Component Value Date   WBC 4.3 09/25/2021   HGB 14.1 09/25/2021   HCT 41.3 09/25/2021   PLT 242 09/25/2021   GLUCOSE 92 10/08/2021   CHOL 213 (H) 09/25/2021   TRIG 123 09/25/2021   HDL 42 09/25/2021   LDLCALC 149 (H) 09/25/2021   ALT 14 09/25/2021   AST 16 09/25/2021   NA 141 10/08/2021   K 4.8 10/08/2021   CL 105 10/08/2021   CREATININE 1.05 (H) 10/08/2021   BUN 16 10/08/2021   CO2 25 10/08/2021   TSH 2.140 10/08/2021   HGBA1C 5.5 04/26/2021    MM 3D SCREEN BREAST BILATERAL  Result Date: 10/24/2021 CLINICAL DATA:  Screening. EXAM: DIGITAL SCREENING BILATERAL MAMMOGRAM WITH TOMOSYNTHESIS AND CAD TECHNIQUE: Bilateral screening digital craniocaudal and mediolateral oblique mammograms were obtained. Bilateral screening digital breast tomosynthesis was performed. The images were evaluated with computer-aided detection. COMPARISON:  Previous exam(s). ACR Breast Density Category b: There are scattered areas of fibroglandular density. FINDINGS: There are no findings suspicious for malignancy. IMPRESSION: No mammographic evidence of malignancy. A result letter of this screening mammogram will be mailed directly to the patient. RECOMMENDATION: Screening mammogram in one year. (Code:SM-B-01Y) BI-RADS CATEGORY  1: Negative. Electronically Signed   By: Sande Brothers M.D.   On: 10/24/2021 16:20       Assessment & Plan:   Problem List Items Addressed This Visit      Hypothyroidism - Primary   Relevant Orders   Hepatic function panel   Basic Metabolic Panel (BMET)   TSH   Other Visit Diagnoses     Screening cholesterol level       Relevant Orders   Lipid Profile        Dale , MD

## 2022-03-27 ENCOUNTER — Other Ambulatory Visit: Payer: Self-pay

## 2022-03-27 ENCOUNTER — Encounter: Payer: Self-pay | Admitting: Internal Medicine

## 2022-03-27 DIAGNOSIS — G5600 Carpal tunnel syndrome, unspecified upper limb: Secondary | ICD-10-CM | POA: Insufficient documentation

## 2022-03-27 DIAGNOSIS — E559 Vitamin D deficiency, unspecified: Secondary | ICD-10-CM | POA: Insufficient documentation

## 2022-03-27 DIAGNOSIS — R7989 Other specified abnormal findings of blood chemistry: Secondary | ICD-10-CM

## 2022-03-27 DIAGNOSIS — Z713 Dietary counseling and surveillance: Secondary | ICD-10-CM | POA: Insufficient documentation

## 2022-03-27 DIAGNOSIS — R739 Hyperglycemia, unspecified: Secondary | ICD-10-CM | POA: Insufficient documentation

## 2022-03-27 MED ORDER — LEVOTHYROXINE SODIUM 137 MCG PO TABS: 137.0000 ug | ORAL_TABLET | Freq: Every day | ORAL | 1 refills | Status: DC

## 2022-03-27 NOTE — Assessment & Plan Note (Signed)
Mammogram 10/24/21 - Birads I.  

## 2022-03-27 NOTE — Assessment & Plan Note (Signed)
Continue vitamin D supplements.  

## 2022-03-27 NOTE — Assessment & Plan Note (Signed)
Had colonoscopy recently.  Need copy of report.

## 2022-03-27 NOTE — Assessment & Plan Note (Signed)
On thyroid replacement.  Check tsh today.   

## 2022-03-27 NOTE — Assessment & Plan Note (Signed)
Overall appears to be handling things relatively well.  Appears to be doing better.  Continue zoloft.  Follow.

## 2022-03-27 NOTE — Assessment & Plan Note (Signed)
Discussed diet and exercise.  Recent elevated a1c.  Start metformin as directed.  Follow.

## 2022-03-27 NOTE — Assessment & Plan Note (Signed)
Saw neurology.  NCS - CTS.  Recommended starting alpha lipoic acid.  Follow.  Splint.

## 2022-03-27 NOTE — Assessment & Plan Note (Signed)
a1c just checked 5.7.  Discussed diet and exercise.  Discussed weight loss medication - options.  Start metformin as directed.  Follow.

## 2022-05-01 ENCOUNTER — Other Ambulatory Visit (INDEPENDENT_AMBULATORY_CARE_PROVIDER_SITE_OTHER): Payer: 59

## 2022-05-01 DIAGNOSIS — R7989 Other specified abnormal findings of blood chemistry: Secondary | ICD-10-CM

## 2022-05-01 LAB — TSH: TSH: 0.44 u[IU]/mL (ref 0.35–5.50)

## 2022-06-21 ENCOUNTER — Other Ambulatory Visit: Payer: Self-pay | Admitting: Internal Medicine

## 2022-06-26 ENCOUNTER — Ambulatory Visit (INDEPENDENT_AMBULATORY_CARE_PROVIDER_SITE_OTHER): Payer: 59 | Admitting: Internal Medicine

## 2022-06-26 ENCOUNTER — Encounter: Payer: Self-pay | Admitting: Internal Medicine

## 2022-06-26 VITALS — BP 100/70 | HR 65 | Temp 97.6°F | Ht 69.0 in | Wt 175.4 lb

## 2022-06-26 DIAGNOSIS — Z803 Family history of malignant neoplasm of breast: Secondary | ICD-10-CM

## 2022-06-26 DIAGNOSIS — R739 Hyperglycemia, unspecified: Secondary | ICD-10-CM

## 2022-06-26 DIAGNOSIS — F439 Reaction to severe stress, unspecified: Secondary | ICD-10-CM

## 2022-06-26 DIAGNOSIS — G5600 Carpal tunnel syndrome, unspecified upper limb: Secondary | ICD-10-CM | POA: Diagnosis not present

## 2022-06-26 DIAGNOSIS — Z0000 Encounter for general adult medical examination without abnormal findings: Secondary | ICD-10-CM | POA: Diagnosis not present

## 2022-06-26 DIAGNOSIS — E039 Hypothyroidism, unspecified: Secondary | ICD-10-CM

## 2022-06-26 NOTE — Assessment & Plan Note (Addendum)
Physical today 06/26/22.  Mammogram 10/24/21 - Birads I.  Colonoscopy 09/2021 - recommended f/u in 5 years. Sees gyn for pelvic/pap smears. PAP 09/2020 - ok.

## 2022-06-26 NOTE — Progress Notes (Signed)
Patient ID: Meagan Calhoun, female   DOB: 1956-01-06, 66 y.o.   MRN: [id number]   Subjective:    Patient ID: Meagan Calhoun, female    DOB: 07/07/1956, 66 y.o.   MRN: [id number]   Patient here for  Chief Complaint  Patient presents with   Follow-up    Physical   .   HPI Here for her physical exam.  Recently started on metformin.  A1c slightly elevated.  Was concerned regarding her weight.  She is doing better overall.  Stress is better.  Was able to get away and go to the beach for the first time in several years.  Stays active.  No chest pain or sob reported.  Does report some issues with sciatic pain - left.  Chiropractor helping.  No abdominal pain.  Bowels moving.     Past Medical History:  Diagnosis Date   Abnormal Pap smear of cervix    Anxiety    GERD (gastroesophageal reflux disease)    H/O jaundice    History of chicken pox    History of esophageal stricture 2012   Hx of colonic polyp    Hypothyroidism    Past Surgical History:  Procedure Laterality Date   BREAST BIOPSY Left    neg   CERVICAL BIOPSY  W/ LOOP ELECTRODE EXCISION  08/2013   chronic cervicitis   CHOLECYSTECTOMY  11/2006   Dr Rochel Brome   COLONOSCOPY  2012, 2017   normal   ESOPHAGOGASTRODUODENOSCOPY (EGD) WITH PROPOFOL N/A 06/22/2015   Procedure: ESOPHAGOGASTRODUODENOSCOPY (EGD) WITH PROPOFOL;  Surgeon: Manya Silvas, MD;  Location: [facility];  Service: Endoscopy;  Laterality: N/A;   LAPAROTOMY  1988   ectopic pregnancy   SAVORY DILATION N/A 06/22/2015   Procedure: SAVORY DILATION;  Surgeon: Manya Silvas, MD;  Location: [facility] ENDOSCOPY;  Service: Endoscopy;  Laterality: N/A;   thyroidectomy Right 2013   partial right thyroidectomy   TUBAL LIGATION     WISDOM TOOTH EXTRACTION     Family History  Problem Relation Age of Onset   Hypothyroidism Mother    Dementia Mother    Heart disease Father        CHF   Breast cancer Sister 35   Alzheimer's disease Sister    Heart attack  Brother 61   Colon cancer Neg Hx    Social History   Socioeconomic History   Marital status: Married    Spouse name: Not on file   Number of children: 0   Years of education: 12   Highest education level: Not on file  Occupational History   Not on file  Tobacco Use   Smoking status: Never   Smokeless tobacco: Never  Vaping Use   Vaping Use: Never used  Substance and Sexual Activity   Alcohol use: Yes    Alcohol/week: 0.0 standard drinks of alcohol   Drug use: No   Sexual activity: Yes    Partners: Male    Birth control/protection: Post-menopausal  Other Topics Concern   Not on file  Social History Narrative   Not on file   Social Determinants of Health   Financial Resource Strain: Not on file  Food Insecurity: Not on file  Transportation Needs: Not on file  Physical Activity: Insufficiently Active (08/15/2017)   Exercise Vital Sign    Days of Exercise per Week: 4 days    Minutes of Exercise per Session: 30 min  Stress: No Stress Concern Present (08/15/2017)   [employer] of  Occupational Health - Occupational Stress Questionnaire    Feeling of Stress : Not at all  Social Connections: Moderately Integrated (08/15/2017)   Social Connection and Isolation Panel [NHANES]    Frequency of Communication with Friends and Family: More than three times a week    Frequency of Social Gatherings with Friends and Family: Once a week    Attends Religious Services: More than 4 times per year    Active Member of [employer] or Organizations: No    Attends Banker Meetings: Never    Marital Status: Married     Review of Systems  Constitutional:  Negative for appetite change and unexpected weight change.  HENT:  Negative for congestion, sinus pressure and sore throat.   Eyes:  Negative for pain and visual disturbance.  Respiratory:  Negative for cough, chest tightness and shortness of breath.   Cardiovascular:  Negative for chest pain, palpitations and leg swelling.   Gastrointestinal:  Negative for abdominal pain, diarrhea, nausea and vomiting.  Genitourinary:  Negative for difficulty urinating and dysuria.  Musculoskeletal:  Negative for joint swelling and myalgias.  Skin:  Negative for color change and rash.  Neurological:  Negative for dizziness, light-headedness and headaches.  Hematological:  Negative for adenopathy. Does not bruise/bleed easily.  Psychiatric/Behavioral:  Negative for agitation and dysphoric mood.        Objective:     BP 100/70 (BP Location: Left Arm, Patient Position: Sitting, Cuff Size: Normal)   Pulse 65   Temp 97.6 F (36.4 C) (Oral)   Ht 5\' 9"  (1.753 m)   Wt 175 lb 6.4 oz (79.6 kg)   SpO2 95%   BMI 25.90 kg/m  Wt Readings from Last 3 Encounters:  06/26/22 175 lb 6.4 oz (79.6 kg)  03/26/22 174 lb 3.2 oz (79 kg)  09/25/21 175 lb (79.4 kg)    Physical Exam Vitals reviewed.  Constitutional:      General: She is not in acute distress.    Appearance: Normal appearance. She is well-developed.  HENT:     Head: Normocephalic and atraumatic.     Right Ear: External ear normal.     Left Ear: External ear normal.  Eyes:     General: No scleral icterus.       Right eye: No discharge.        Left eye: No discharge.     Conjunctiva/sclera: Conjunctivae normal.  Neck:     Thyroid: No thyromegaly.  Cardiovascular:     Rate and Rhythm: Normal rate and regular rhythm.  Pulmonary:     Effort: No tachypnea, accessory muscle usage or respiratory distress.     Breath sounds: Normal breath sounds. No decreased breath sounds or wheezing.  Chest:  Breasts:    Right: No inverted nipple, mass, nipple discharge or tenderness (no axillary adenopathy).     Left: No inverted nipple, mass, nipple discharge or tenderness (no axilarry adenopathy).  Abdominal:     General: Bowel sounds are normal.     Palpations: Abdomen is soft.     Tenderness: There is no abdominal tenderness.  Musculoskeletal:        General: No swelling or  tenderness.     Cervical back: Neck supple.  Lymphadenopathy:     Cervical: No cervical adenopathy.  Skin:    Findings: No erythema or rash.  Neurological:     Mental Status: She is alert and oriented to person, place, and time.  Psychiatric:  Mood and Affect: Mood normal.        Behavior: Behavior normal.      Outpatient Encounter Medications as of 06/26/2022  Medication Sig   Biotin 10 MG CAPS Take by mouth.   Cyanocobalamin (VITAMIN B-12 PO) Take by mouth.   FOLIC ACID PO Take by mouth.   levothyroxine (SYNTHROID) 137 MCG tablet Take 1 tablet (137 mcg total) by mouth daily before breakfast.   metFORMIN (GLUCOPHAGE-XR) 500 MG 24 hr tablet TAKE 1 TABLET(500 MG) BY MOUTH DAILY WITH BREAKFAST   sertraline (ZOLOFT) 100 MG tablet TAKE 1 TABLET(100 MG) BY MOUTH DAILY   VITAMIN D PO Take by mouth.   [DISCONTINUED] Alpha-Lipoic Acid 600 MG CAPS Take by mouth. (Patient not taking: Reported on 06/26/2022)   No facility-administered encounter medications on file as of 06/26/2022.     Lab Results  Component Value Date   WBC 4.3 09/25/2021   HGB 14.1 09/25/2021   HCT 41.3 09/25/2021   PLT 242 09/25/2021   GLUCOSE 74 03/26/2022   CHOL 179 03/26/2022   TRIG 137.0 03/26/2022   HDL 36.70 (L) 03/26/2022   LDLCALC 115 (H) 03/26/2022   ALT 12 03/26/2022   AST 14 03/26/2022   NA 139 03/26/2022   K 4.0 03/26/2022   CL 105 03/26/2022   CREATININE 0.98 03/26/2022   BUN 16 03/26/2022   CO2 26 03/26/2022   TSH 2.570 06/27/2022   HGBA1C 5.5 04/26/2021    MM 3D SCREEN BREAST BILATERAL  Result Date: 10/24/2021 CLINICAL DATA:  Screening. EXAM: DIGITAL SCREENING BILATERAL MAMMOGRAM WITH TOMOSYNTHESIS AND CAD TECHNIQUE: Bilateral screening digital craniocaudal and mediolateral oblique mammograms were obtained. Bilateral screening digital breast tomosynthesis was performed. The images were evaluated with computer-aided detection. COMPARISON:  Previous exam(s). ACR Breast Density Category  b: There are scattered areas of fibroglandular density. FINDINGS: There are no findings suspicious for malignancy. IMPRESSION: No mammographic evidence of malignancy. A result letter of this screening mammogram will be mailed directly to the patient. RECOMMENDATION: Screening mammogram in one year. (Code:SM-B-01Y) BI-RADS CATEGORY  1: Negative. Electronically Signed   By: Sande Brothers M.D.   On: 10/24/2021 16:20       Assessment & Plan:   Problem List Items Addressed This Visit     CTS (carpal tunnel syndrome)    Saw neurology.  NCS - CTS.  Follow.  Splint.       Family history of breast cancer    Mammogram 10/24/21 - Birads I.       Health care maintenance    Physical today 06/26/22.  Mammogram 10/24/21 - Birads I.  Colonoscopy 09/2021 - recommended f/u in 5 years. Sees gyn for pelvic/pap smears. PAP 09/2020 - ok.       Hyperglycemia    Discussed diet and exercise.  Discussed weight loss medication - options.  Started on metformin last visit.  Follow.       Hypothyroidism    On thyroid replacement.  Follow tsh.       Relevant Orders   TSH (Completed)   Stress    Overall doing better.  Feels better.  Continue zoloft.       Other Visit Diagnoses     Routine general medical examination at a health care facility    -  Primary        Dale [place], MD

## 2022-06-28 LAB — TSH: TSH: 2.57 u[IU]/mL (ref 0.450–4.500)

## 2022-06-30 ENCOUNTER — Encounter: Payer: Self-pay | Admitting: Internal Medicine

## 2022-06-30 NOTE — Assessment & Plan Note (Signed)
On thyroid replacement.  Follow tsh.  

## 2022-06-30 NOTE — Assessment & Plan Note (Signed)
Mammogram 10/24/21 - Birads I.

## 2022-06-30 NOTE — Assessment & Plan Note (Signed)
Discussed diet and exercise.  Discussed weight loss medication - options.  Started on metformin last visit.  Follow.

## 2022-06-30 NOTE — Assessment & Plan Note (Signed)
Overall doing better.  Feels better.  Continue zoloft.

## 2022-06-30 NOTE — Assessment & Plan Note (Signed)
Saw neurology.  NCS - CTS.  Follow.  Splint.

## 2022-10-07 ENCOUNTER — Other Ambulatory Visit: Payer: Self-pay | Admitting: Internal Medicine

## 2022-10-22 ENCOUNTER — Telehealth: Payer: Self-pay | Admitting: Internal Medicine

## 2022-10-22 MED ORDER — BENZONATATE 100 MG PO CAPS: 100.0000 mg | ORAL_CAPSULE | Freq: Three times a day (TID) | ORAL | 0 refills | Status: DC | PRN

## 2022-10-22 NOTE — Telephone Encounter (Signed)
Tessalon perles sent in. Patient is aware of below.

## 2022-10-22 NOTE — Telephone Encounter (Signed)
Patient had COVID end of January and is still having a lingering cough. Confirmed no other symptoms. Feeling much better. Wondering if she could get tessalon perles sent in for the cough and f/u with you at her appt on 2/12. Also mentioned robitussin DM or delsym may help her. She has not tried either one. Just wanted to see which one you recommend.

## 2022-10-22 NOTE — Telephone Encounter (Signed)
She can try delsym cough syrup.  I am also ok to send in tessalon perles 100mg  tid prn cough #21 with no refills.  Let us know if persistent problems.

## 2022-10-22 NOTE — Addendum Note (Signed)
Addended by: Roetta Sessions D on: 10/22/2022 01:12 PM   Modules accepted: Orders

## 2022-10-22 NOTE — Telephone Encounter (Signed)
Patient tested positive for COVID 1/29, she went to Urgent Care and was prescribed antiviral medication. She has a terrible cough and wanted to know if Dr Nicki Reaper will prescribe Benzonatate.

## 2022-10-28 ENCOUNTER — Ambulatory Visit: Payer: 59 | Admitting: Internal Medicine

## 2022-10-28 ENCOUNTER — Encounter: Payer: Self-pay | Admitting: Internal Medicine

## 2022-10-28 ENCOUNTER — Ambulatory Visit (INDEPENDENT_AMBULATORY_CARE_PROVIDER_SITE_OTHER): Payer: 59

## 2022-10-28 VITALS — BP 126/70 | HR 65 | Temp 97.8°F | Resp 16 | Ht 69.0 in | Wt 176.6 lb

## 2022-10-28 DIAGNOSIS — E039 Hypothyroidism, unspecified: Secondary | ICD-10-CM | POA: Diagnosis not present

## 2022-10-28 DIAGNOSIS — R053 Chronic cough: Secondary | ICD-10-CM | POA: Diagnosis not present

## 2022-10-28 DIAGNOSIS — Z1231 Encounter for screening mammogram for malignant neoplasm of breast: Secondary | ICD-10-CM | POA: Diagnosis not present

## 2022-10-28 DIAGNOSIS — E559 Vitamin D deficiency, unspecified: Secondary | ICD-10-CM | POA: Diagnosis not present

## 2022-10-28 DIAGNOSIS — R739 Hyperglycemia, unspecified: Secondary | ICD-10-CM | POA: Diagnosis not present

## 2022-10-28 DIAGNOSIS — Z86010 Personal history of colonic polyps: Secondary | ICD-10-CM

## 2022-10-28 DIAGNOSIS — R87619 Unspecified abnormal cytological findings in specimens from cervix uteri: Secondary | ICD-10-CM

## 2022-10-28 DIAGNOSIS — F439 Reaction to severe stress, unspecified: Secondary | ICD-10-CM

## 2022-10-28 MED ORDER — PREDNISONE 10 MG PO TABS: ORAL_TABLET | ORAL | 0 refills | Status: DC

## 2022-10-28 NOTE — Progress Notes (Signed)
p  Subjective:    Patient ID: Meagan Calhoun, female    DOB: November 01, 1955, 67 y.o.   MRN: [id number]  Patient here for  Chief Complaint  Patient presents with   Medical Management of Chronic Issues    HPI Here to follow up regarding hyperglycemia and hypothyroidism.  Recently had covid.  Tested positive 10/14/22.  Seen urgent care - given antiviral medication.  Is feeling better, but still with increased cough.  No fever now.  No chest congestion.  Cough is mostly dry.  Increased congestion - in throat.  Decreased energy.  Fatigue.  Decreased appetite.  No vomiting and no diarrhea now.  Describes coughing fits.   Affecting sleep. Due mammogram.  Last 10/24/21.    Past Medical History:  Diagnosis Date   Abnormal Pap smear of cervix    Anxiety    GERD (gastroesophageal reflux disease)    H/O jaundice    History of chicken pox    History of esophageal stricture 2012   Hx of colonic polyp    Hypothyroidism    Past Surgical History:  Procedure Laterality Date   BREAST BIOPSY Left    neg   CERVICAL BIOPSY  W/ LOOP ELECTRODE EXCISION  08/2013   chronic cervicitis   CHOLECYSTECTOMY  11/2006   Dr Rochel Brome   COLONOSCOPY  2012, 2017   normal   ESOPHAGOGASTRODUODENOSCOPY (EGD) WITH PROPOFOL N/A 06/22/2015   Procedure: ESOPHAGOGASTRODUODENOSCOPY (EGD) WITH PROPOFOL;  Surgeon: Manya Silvas, MD;  Location: [facility];  Service: Endoscopy;  Laterality: N/A;   LAPAROTOMY  1988   ectopic pregnancy   SAVORY DILATION N/A 06/22/2015   Procedure: SAVORY DILATION;  Surgeon: Manya Silvas, MD;  Location: [facility] ENDOSCOPY;  Service: Endoscopy;  Laterality: N/A;   thyroidectomy Right 2013   partial right thyroidectomy   TUBAL LIGATION     WISDOM TOOTH EXTRACTION     Family History  Problem Relation Age of Onset   Hypothyroidism Mother    Dementia Mother    Heart disease Father        CHF   Breast cancer Sister 62   Alzheimer's disease Sister    Heart attack Brother 70   Colon  cancer Neg Hx    Social History   Socioeconomic History   Marital status: Married    Spouse name: Not on file   Number of children: 0   Years of education: 12   Highest education level: Not on file  Occupational History   Not on file  Tobacco Use   Smoking status: Never   Smokeless tobacco: Never  Vaping Use   Vaping Use: Never used  Substance and Sexual Activity   Alcohol use: Yes    Alcohol/week: 0.0 standard drinks of alcohol   Drug use: No   Sexual activity: Yes    Partners: Male    Birth control/protection: Post-menopausal  Other Topics Concern   Not on file  Social History Narrative   Not on file   Social Determinants of Health   Financial Resource Strain: Not on file  Food Insecurity: Not on file  Transportation Needs: Not on file  Physical Activity: Insufficiently Active (08/15/2017)   Exercise Vital Sign    Days of Exercise per Week: 4 days    Minutes of Exercise per Session: 30 min  Stress: No Stress Concern Present (08/15/2017)   [place]    Feeling of Stress : Not at all  Social  Connections: Moderately Integrated (08/15/2017)   Social Connection and Isolation Panel [NHANES]    Frequency of Communication with Friends and Family: More than three times a week    Frequency of Social Gatherings with Friends and Family: Once a week    Attends Religious Services: More than 4 times per year    Active Member of [employer] or Organizations: No    Attends Archivist Meetings: Never    Marital Status: Married     Review of Systems  Constitutional:  Positive for appetite change and fatigue.  HENT:  Negative for sinus pressure.        Throat congestion.   Respiratory:  Positive for cough. Negative for shortness of breath.   Cardiovascular:  Negative for chest pain, palpitations and leg swelling.  Gastrointestinal:  Negative for abdominal pain, diarrhea, nausea and vomiting.   Genitourinary:  Negative for difficulty urinating and dysuria.  Musculoskeletal:  Negative for joint swelling and myalgias.  Skin:  Negative for color change and rash.  Neurological:  Negative for dizziness and headaches.  Psychiatric/Behavioral:  Negative for agitation and dysphoric mood.        Objective:     BP 126/70   Pulse 65   Temp 97.8 F (36.6 C)   Resp 16   Ht 5' 9"$  (1.753 m)   Wt 176 lb 9.6 oz (80.1 kg)   SpO2 98%   BMI 26.08 kg/m  Wt Readings from Last 3 Encounters:  10/28/22 176 lb 9.6 oz (80.1 kg)  06/26/22 175 lb 6.4 oz (79.6 kg)  03/26/22 174 lb 3.2 oz (79 kg)    Physical Exam Vitals reviewed.  Constitutional:      General: She is not in acute distress.    Appearance: Normal appearance.  HENT:     Head: Normocephalic and atraumatic.     Right Ear: External ear normal.     Left Ear: External ear normal.  Eyes:     General: No scleral icterus.       Right eye: No discharge.        Left eye: No discharge.     Conjunctiva/sclera: Conjunctivae normal.  Neck:     Thyroid: No thyromegaly.  Cardiovascular:     Rate and Rhythm: Normal rate and regular rhythm.  Pulmonary:     Effort: No respiratory distress.     Breath sounds: Normal breath sounds. No wheezing.     Comments: Increased cough with forced expiration.  Abdominal:     General: Bowel sounds are normal.     Palpations: Abdomen is soft.     Tenderness: There is no abdominal tenderness.  Musculoskeletal:        General: No swelling or tenderness.     Cervical back: Neck supple. No tenderness.  Lymphadenopathy:     Cervical: No cervical adenopathy.  Skin:    Findings: No erythema or rash.  Neurological:     Mental Status: She is alert.  Psychiatric:        Mood and Affect: Mood normal.        Behavior: Behavior normal.      Outpatient Encounter Medications as of 10/28/2022  Medication Sig   predniSONE (DELTASONE) 10 MG tablet Take 4 tablets x 1 day and then decrease by 1/2 tablet  per day until down to zero mg.   benzonatate (TESSALON) 100 MG capsule Take 1 capsule (100 mg total) by mouth 3 (three) times daily as needed for cough.   Biotin 10 MG  CAPS Take by mouth.   Cyanocobalamin (VITAMIN B-12 PO) Take by mouth.   FOLIC ACID PO Take by mouth.   levothyroxine (SYNTHROID) 137 MCG tablet TAKE 1 TABLET(137 MCG) BY MOUTH DAILY BEFORE BREAKFAST   metFORMIN (GLUCOPHAGE-XR) 500 MG 24 hr tablet TAKE 1 TABLET(500 MG) BY MOUTH DAILY WITH BREAKFAST   sertraline (ZOLOFT) 100 MG tablet TAKE 1 TABLET(100 MG) BY MOUTH DAILY   VITAMIN D PO Take by mouth.   No facility-administered encounter medications on file as of 10/28/2022.     Lab Results  Component Value Date   WBC 4.3 09/25/2021   HGB 14.1 09/25/2021   HCT 41.3 09/25/2021   PLT 242 09/25/2021   GLUCOSE 74 03/26/2022   CHOL 179 03/26/2022   TRIG 137.0 03/26/2022   HDL 36.70 (L) 03/26/2022   LDLCALC 115 (H) 03/26/2022   ALT 12 03/26/2022   AST 14 03/26/2022   NA 139 03/26/2022   K 4.0 03/26/2022   CL 105 03/26/2022   CREATININE 0.98 03/26/2022   BUN 16 03/26/2022   CO2 26 03/26/2022   TSH 2.570 06/27/2022   HGBA1C 5.5 04/26/2021    MM 3D SCREEN BREAST BILATERAL  Result Date: 10/24/2021 CLINICAL DATA:  Screening. EXAM: DIGITAL SCREENING BILATERAL MAMMOGRAM WITH TOMOSYNTHESIS AND CAD TECHNIQUE: Bilateral screening digital craniocaudal and mediolateral oblique mammograms were obtained. Bilateral screening digital breast tomosynthesis was performed. The images were evaluated with computer-aided detection. COMPARISON:  Previous exam(s). ACR Breast Density Category b: There are scattered areas of fibroglandular density. FINDINGS: There are no findings suspicious for malignancy. IMPRESSION: No mammographic evidence of malignancy. A result letter of this screening mammogram will be mailed directly to the patient. RECOMMENDATION: Screening mammogram in one year. (Code:SM-B-01Y) BI-RADS CATEGORY  1: Negative. Electronically  Signed   By: Kristopher Oppenheim M.D.   On: 10/24/2021 16:20       Assessment & Plan:  Hyperglycemia Assessment & Plan: Discussed diet and exercise.  On metformin. Follow.   Orders: -     CBC with Differential/Platelet; Future -     Basic metabolic panel; Future -     Hepatic function panel; Future -     Lipid panel; Future -     Hemoglobin A1c; Future  Hypothyroidism, unspecified type Assessment & Plan: On thyroid replacement.  Follow tsh.   Orders: -     CBC with Differential/Platelet; Future -     TSH; Future  Vitamin D deficiency -     VITAMIN D 25 Hydroxy (Vit-D Deficiency, Fractures); Future  Visit for screening mammogram -     3D Screening Mammogram, Left and Right; Future  Persistent cough Assessment & Plan: Persistent cough.  Check cxr.  No acid reflux reported.  Tested positive for covid 10/14/22.  Treated - oral antiviral.  Feeling better.  Persistent coughing fits and cough keeping her awake at night. Using tessalon perles.  Treat with prednisone taper as directed.  Follow.  Call with update.     Orders: -     DG Chest 2 View; Future  Abnormal cervical Papanicolaou smear, unspecified abnormal pap finding Assessment & Plan: PAP 09/2020 - ok.     History of colonic polyps Assessment & Plan: Colonoscopy 10/15/21 - colon polyp - ascending - tubular adenoma.  Recommended f/u in 5 years.     Stress Assessment & Plan: Overall appears to be handling things well.  Follow. Continue zoloft.   Other orders -     predniSONE; Take 4 tablets x 1 day  and then decrease by 1/2 tablet per day until down to zero mg.  Dispense: 18 tablet; Refill: 0     Einar Pheasant, MD

## 2022-10-28 NOTE — Assessment & Plan Note (Signed)
Discussed diet and exercise.  On metformin. Follow.

## 2022-11-03 ENCOUNTER — Encounter: Payer: Self-pay | Admitting: Internal Medicine

## 2022-11-03 NOTE — Assessment & Plan Note (Signed)
PAP 09/2020 - ok.

## 2022-11-03 NOTE — Assessment & Plan Note (Signed)
On thyroid replacement.  Follow tsh.  

## 2022-11-03 NOTE — Assessment & Plan Note (Signed)
Persistent cough.  Check cxr.  No acid reflux reported.  Tested positive for covid 10/14/22.  Treated - oral antiviral.  Feeling better.  Persistent coughing fits and cough keeping her awake at night. Using tessalon perles.  Treat with prednisone taper as directed.  Follow.  Call with update.

## 2022-11-03 NOTE — Assessment & Plan Note (Signed)
Overall appears to be handling things well.  Follow. Continue zoloft.

## 2022-11-03 NOTE — Assessment & Plan Note (Signed)
Colonoscopy 10/15/21 - colon polyp - ascending - tubular adenoma.  Recommended f/u in 5 years.

## 2022-11-13 ENCOUNTER — Ambulatory Visit
Admission: RE | Admit: 2022-11-13 | Discharge: 2022-11-13 | Disposition: A | Discharge status: Home / Self Care | Payer: 59 | Source: Ambulatory Visit | Attending: Internal Medicine | Admitting: Internal Medicine

## 2022-11-13 DIAGNOSIS — Z1231 Encounter for screening mammogram for malignant neoplasm of breast: Secondary | ICD-10-CM | POA: Insufficient documentation

## 2022-12-02 ENCOUNTER — Other Ambulatory Visit: Payer: Self-pay | Admitting: Internal Medicine

## 2022-12-02 NOTE — Telephone Encounter (Signed)
Rx ok'd for zoloft #90 with one refill.

## 2022-12-25 ENCOUNTER — Encounter: Payer: Self-pay | Admitting: Internal Medicine

## 2022-12-25 ENCOUNTER — Ambulatory Visit: Payer: 59 | Admitting: Internal Medicine

## 2022-12-25 VITALS — BP 108/70 | HR 69 | Temp 98.0°F | Resp 16 | Ht 69.0 in | Wt 178.0 lb

## 2022-12-25 DIAGNOSIS — R000 Tachycardia, unspecified: Secondary | ICD-10-CM

## 2022-12-25 DIAGNOSIS — Z9109 Other allergy status, other than to drugs and biological substances: Secondary | ICD-10-CM

## 2022-12-25 DIAGNOSIS — E039 Hypothyroidism, unspecified: Secondary | ICD-10-CM | POA: Diagnosis not present

## 2022-12-25 DIAGNOSIS — Z13220 Encounter for screening for lipoid disorders: Secondary | ICD-10-CM

## 2022-12-25 DIAGNOSIS — R739 Hyperglycemia, unspecified: Secondary | ICD-10-CM | POA: Diagnosis not present

## 2022-12-25 DIAGNOSIS — E559 Vitamin D deficiency, unspecified: Secondary | ICD-10-CM

## 2022-12-25 DIAGNOSIS — R1310 Dysphagia, unspecified: Secondary | ICD-10-CM | POA: Diagnosis not present

## 2022-12-25 DIAGNOSIS — Z860100 Personal history of colon polyps, unspecified: Secondary | ICD-10-CM

## 2022-12-25 DIAGNOSIS — F439 Reaction to severe stress, unspecified: Secondary | ICD-10-CM

## 2022-12-25 DIAGNOSIS — Z803 Family history of malignant neoplasm of breast: Secondary | ICD-10-CM

## 2022-12-25 DIAGNOSIS — H9190 Unspecified hearing loss, unspecified ear: Secondary | ICD-10-CM

## 2022-12-25 DIAGNOSIS — R079 Chest pain, unspecified: Secondary | ICD-10-CM

## 2022-12-25 MED ORDER — LEVOTHYROXINE SODIUM 137 MCG PO TABS: ORAL_TABLET | ORAL | 1 refills | Status: DC

## 2022-12-25 MED ORDER — SERTRALINE HCL 100 MG PO TABS: ORAL_TABLET | ORAL | 1 refills | Status: DC

## 2022-12-25 NOTE — Patient Instructions (Signed)
Take pepcid 20mg  - one tablet 30 minutes before breakfast  Zyrtc (or claritin) - one tablet per day  Nasacort nasal spray - 2 sprays each nostril one time per day.  Do this in the evening.

## 2022-12-25 NOTE — Progress Notes (Addendum)
Subjective:    Patient ID: Meagan Calhoun, female    DOB: 09/26/1955, 67 y.o.   MRN: [id number]  Patient here for  Chief Complaint  Patient presents with   Medical Management of Chronic Issues    HPI Here to follow up regarding hyperglycemia and hypothyroidism. Reports increased runny nose and sneezing recently.  Some increased cough at night and early am.  Discussed allergies.  Also reports decreased hearing.  Discussed ent evaluation.  Has noticed when walking up incline - has to stop.  Has noticed some chest discomfort.  Also has had intermittent episodes - increased heart rate - occurring while sitting.  Last episode - two weeks ago.  Also reports some issues with swallowing.  Previously required esophageal dilatation.  Food will get hung up. She has to stick her finger down her throat.  Had an episode - pill stuck.  Fluids would not go down.  Appears to be worsening.  Discussed eating slowly, taking small bites and chewing food well.  Discussed referral back to GI.   On zoloft.    Past Medical History:  Diagnosis Date   Abnormal Pap smear of cervix    Anxiety    GERD (gastroesophageal reflux disease)    H/O jaundice    History of chicken pox    History of esophageal stricture 2012   Hx of colonic polyp    Hypothyroidism    Past Surgical History:  Procedure Laterality Date   BREAST BIOPSY Left    neg   CERVICAL BIOPSY  W/ LOOP ELECTRODE EXCISION  08/2013   chronic cervicitis   CHOLECYSTECTOMY  11/2006   Dr Renda Rolls   COLONOSCOPY  2012, 2017   normal   ESOPHAGOGASTRODUODENOSCOPY (EGD) WITH PROPOFOL N/A 06/22/2015   Procedure: ESOPHAGOGASTRODUODENOSCOPY (EGD) WITH PROPOFOL;  Surgeon: Scot Jun, MD;  Location: [facility] ENDOSCOPY;  Service: Endoscopy;  Laterality: N/A;   LAPAROTOMY  1988   ectopic pregnancy   SAVORY DILATION N/A 06/22/2015   Procedure: SAVORY DILATION;  Surgeon: Scot Jun, MD;  Location: [facility] ENDOSCOPY;  Service: Endoscopy;  Laterality: N/A;    thyroidectomy Right 2013   partial right thyroidectomy   TUBAL LIGATION     WISDOM TOOTH EXTRACTION     Family History  Problem Relation Age of Onset   Hypothyroidism Mother    Dementia Mother    Heart disease Father        CHF   Breast cancer Sister 82   Alzheimer's disease Sister    Heart attack Brother 46   Colon cancer Neg Hx    Social History   Socioeconomic History   Marital status: Married    Spouse name: Not on file   Number of children: 0   Years of education: 12   Highest education level: Not on file  Occupational History   Not on file  Tobacco Use   Smoking status: Never   Smokeless tobacco: Never  Vaping Use   Vaping Use: Never used  Substance and Sexual Activity   Alcohol use: Yes    Alcohol/week: 0.0 standard drinks of alcohol   Drug use: No   Sexual activity: Yes    Partners: Male    Birth control/protection: Post-menopausal  Other Topics Concern   Not on file  Social History Narrative   Not on file   Social Determinants of Health   Financial Resource Strain: Not on file  Food Insecurity: Not on file  Transportation Needs: Not on file  Physical  Activity: Insufficiently Active (08/15/2017)   Exercise Vital Sign    Days of Exercise per Week: 4 days    Minutes of Exercise per Session: 30 min  Stress: No Stress Concern Present (08/15/2017)   [employer] of Occupational Health - Occupational Stress Questionnaire    Feeling of Stress : Not at all  Social Connections: Moderately Integrated (08/15/2017)   Social Connection and Isolation Panel [NHANES]    Frequency of Communication with Friends and Family: More than three times a week    Frequency of Social Gatherings with Friends and Family: Once a week    Attends Religious Services: More than 4 times per year    Active Member of [employer] or Organizations: No    Attends Banker Meetings: Never    Marital Status: Married     Review of Systems  Constitutional:  Negative for  appetite change and unexpected weight change.  HENT:  Positive for congestion, rhinorrhea and sneezing.   Respiratory:  Positive for cough. Negative for chest tightness and shortness of breath.   Cardiovascular:  Negative for chest pain and palpitations.  Gastrointestinal:  Negative for abdominal pain, diarrhea, nausea and vomiting.       Dysphagia as outlined.   Genitourinary:  Negative for difficulty urinating and dysuria.  Musculoskeletal:  Negative for joint swelling and myalgias.  Skin:  Negative for color change and rash.  Neurological:  Negative for dizziness and headaches.  Psychiatric/Behavioral:  Negative for agitation and dysphoric mood.        Objective:     BP 108/70   Pulse 69   Temp 98 F (36.7 C)   Resp 16   Ht 5\' 9"  (1.753 m)   Wt 178 lb (80.7 kg)   SpO2 98%   BMI 26.29 kg/m  Wt Readings from Last 3 Encounters:  12/25/22 178 lb (80.7 kg)  10/28/22 176 lb 9.6 oz (80.1 kg)  06/26/22 175 lb 6.4 oz (79.6 kg)    Physical Exam Vitals reviewed.  Constitutional:      General: She is not in acute distress.    Appearance: Normal appearance.  HENT:     Head: Normocephalic and atraumatic.     Right Ear: External ear normal.     Left Ear: External ear normal.  Eyes:     General: No scleral icterus.       Right eye: No discharge.        Left eye: No discharge.     Conjunctiva/sclera: Conjunctivae normal.  Neck:     Thyroid: No thyromegaly.  Cardiovascular:     Rate and Rhythm: Normal rate and regular rhythm.  Pulmonary:     Effort: No respiratory distress.     Breath sounds: Normal breath sounds. No wheezing.  Abdominal:     General: Bowel sounds are normal.     Palpations: Abdomen is soft.     Tenderness: There is no abdominal tenderness.  Musculoskeletal:        General: No swelling or tenderness.     Cervical back: Neck supple. No tenderness.  Lymphadenopathy:     Cervical: No cervical adenopathy.  Skin:    Findings: No erythema or rash.   Neurological:     Mental Status: She is alert.  Psychiatric:        Mood and Affect: Mood normal.        Behavior: Behavior normal.      Outpatient Encounter Medications as of 12/25/2022  Medication Sig   Biotin  10 MG CAPS Take by mouth.   Cyanocobalamin (VITAMIN B-12 PO) Take by mouth.   FOLIC ACID PO Take by mouth.   levothyroxine (SYNTHROID) 137 MCG tablet TAKE 1 TABLET(137 MCG) BY MOUTH DAILY BEFORE BREAKFAST   sertraline (ZOLOFT) 100 MG tablet TAKE 1 TABLET(100 MG) BY MOUTH DAILY   VITAMIN D PO Take by mouth.   [DISCONTINUED] benzonatate (TESSALON) 100 MG capsule Take 1 capsule (100 mg total) by mouth 3 (three) times daily as needed for cough.   [DISCONTINUED] levothyroxine (SYNTHROID) 137 MCG tablet TAKE 1 TABLET(137 MCG) BY MOUTH DAILY BEFORE BREAKFAST   [DISCONTINUED] metFORMIN (GLUCOPHAGE-XR) 500 MG 24 hr tablet TAKE 1 TABLET(500 MG) BY MOUTH DAILY WITH BREAKFAST   [DISCONTINUED] predniSONE (DELTASONE) 10 MG tablet Take 4 tablets x 1 day and then decrease by 1/2 tablet per day until down to zero mg.   [DISCONTINUED] sertraline (ZOLOFT) 100 MG tablet TAKE 1 TABLET(100 MG) BY MOUTH DAILY   No facility-administered encounter medications on file as of 12/25/2022.     Lab Results  Component Value Date   WBC 3.8 12/25/2022   HGB 13.3 12/25/2022   HCT 40.9 12/25/2022   PLT 229 12/25/2022   GLUCOSE 90 12/25/2022   CHOL 168 12/25/2022   TRIG 71 12/25/2022   HDL 40 12/25/2022   LDLCALC 114 (H) 12/25/2022   ALT 16 12/25/2022   AST 14 12/25/2022   NA 141 12/25/2022   K 4.6 12/25/2022   CL 105 12/25/2022   CREATININE 0.95 12/25/2022   BUN 18 12/25/2022   CO2 24 12/25/2022   TSH 2.380 12/25/2022   HGBA1C 5.7 (H) 12/25/2022    MM 3D SCREEN BREAST BILATERAL  Result Date: 11/15/2022 CLINICAL DATA:  Screening. EXAM: DIGITAL SCREENING BILATERAL MAMMOGRAM WITH TOMOSYNTHESIS AND CAD TECHNIQUE: Bilateral screening digital craniocaudal and mediolateral oblique mammograms were  obtained. Bilateral screening digital breast tomosynthesis was performed. The images were evaluated with computer-aided detection. COMPARISON:  Previous exam(s). ACR Breast Density Category c: The breasts are heterogeneously dense, which may obscure small masses. FINDINGS: There are no findings suspicious for malignancy. IMPRESSION: No mammographic evidence of malignancy. A result letter of this screening mammogram will be mailed directly to the patient. RECOMMENDATION: Screening mammogram in one year. (Code:SM-B-01Y) BI-RADS CATEGORY  1: Negative. Electronically Signed   By: Annia Beltrew  Davis M.D.   On: 11/15/2022 13:58       Assessment & Plan:  Dysphagia, unspecified type Assessment & Plan: Significant issue with dysphagia as outlined.  Reports some issues with swallowing.  Previously required esophageal dilatation.  Food will get hung up. She has to stick her finger down her throat.  Had an episode - pill stuck.  Fluids would not go down.  Appears to be worsening.  Discussed eating slowly, taking small bites and chewing food well.  Discussed referral back to GI.    Orders: -     Ambulatory referral to Gastroenterology  Vitamin D deficiency Assessment & Plan: Continue vitamin D supplements.   Orders: -     VITAMIN D 25 Hydroxy (Vit-D Deficiency, Fractures)  Hyperglycemia Assessment & Plan: Discussed diet and exercise.  Off metformin. Follow.   Orders: -     CBC with Differential/Platelet -     Basic metabolic panel -     Hemoglobin A1c  Hypothyroidism, unspecified type Assessment & Plan: On thyroid replacement.  Follow tsh.   Orders: -     CBC with Differential/Platelet -     Basic metabolic panel -  TSH  Screening cholesterol level -     Lipid panel -     Hepatic function panel  Increased heart rate Assessment & Plan: Episodes of increased heart rate and outlined.  Occurs while sitting.  Last episode two weeks ago.  Given intermittent episodes and chest pain noted with  inclines, will refer back to cardiology for further evaluation.   Orders: -     EKG 12-Lead -     Ambulatory referral to Cardiology  Hearing loss, unspecified hearing loss type, unspecified laterality -     Ambulatory referral to ENT  Chest pain, unspecified type Assessment & Plan: Reports chest pain with inclines.  Also intermittent increased heart racing. This mostly occurs when sitting.  EKG - SR/SB with no acute ischemic changes.  Given symptoms, do feel f/u with cardiology is warranted - question of need for further cardiac testing.    Orders: -     Ambulatory referral to Cardiology  Family history of breast cancer Assessment & Plan: Mammogram 11/15/22 - Birads I.    History of colonic polyps Assessment & Plan: Colonoscopy 10/15/21 - colon polyp - ascending - tubular adenoma.  Recommended f/u in 5 years.     Stress Assessment & Plan: Overall appears to be handling things well.  Follow. Continue zoloft.   Other orders -     Levothyroxine Sodium; TAKE 1 TABLET(137 MCG) BY MOUTH DAILY BEFORE BREAKFAST  Dispense: 90 tablet; Refill: 1 -     Sertraline HCl; TAKE 1 TABLET(100 MG) BY MOUTH DAILY  Dispense: 90 tablet; Refill: 1   I spent 45 minutes with the patient.  This time was spent in addition to performing the EKG. Time spent discussing her current concerns and symptoms.  Specifically time spent discussing her chest pain, sob and swallowing issues. Time also spent discussing further w/up, evaluation and treatment.    [place], MD

## 2022-12-26 LAB — BASIC METABOLIC PANEL
BUN/Creatinine Ratio: 19 (ref 12–28)
BUN: 18 mg/dL (ref 8–27)
CO2: 24 mmol/L (ref 20–29)
Calcium: 9.3 mg/dL (ref 8.7–10.3)
Chloride: 105 mmol/L (ref 96–106)
Creatinine, Ser: 0.95 mg/dL (ref 0.57–1.00)
Glucose: 90 mg/dL (ref 70–99)
Potassium: 4.6 mmol/L (ref 3.5–5.2)
Sodium: 141 mmol/L (ref 134–144)
eGFR: 66 mL/min/{1.73_m2} (ref >59)

## 2022-12-26 LAB — CBC WITH DIFFERENTIAL/PLATELET
Basophils Absolute: 0 10*3/uL (ref 0.0–0.2)
Basos: 1 %
EOS (ABSOLUTE): 0.1 10*3/uL (ref 0.0–0.4)
Eos: 3 %
Hematocrit: 40.9 % (ref 34.0–46.6)
Hemoglobin: 13.3 g/dL (ref 11.1–15.9)
Immature Grans (Abs): 0 10*3/uL (ref 0.0–0.1)
Immature Granulocytes: 0 %
Lymphocytes Absolute: 1.2 10*3/uL (ref 0.7–3.1)
Lymphs: 32 %
MCH: 28.8 pg (ref 26.6–33.0)
MCHC: 32.5 g/dL (ref 31.5–35.7)
MCV: 89 fL (ref 79–97)
Monocytes Absolute: 0.5 10*3/uL (ref 0.1–0.9)
Monocytes: 13 %
Neutrophils Absolute: 1.9 10*3/uL (ref 1.4–7.0)
Neutrophils: 51 %
Platelets: 229 10*3/uL (ref 150–450)
RBC: 4.62 x10E6/uL (ref 3.77–5.28)
RDW: 14.1 % (ref 11.7–15.4)
WBC: 3.8 10*3/uL (ref 3.4–10.8)

## 2022-12-26 LAB — HEPATIC FUNCTION PANEL
ALT: 16 IU/L (ref 0–32)
AST: 14 IU/L (ref 0–40)
Albumin: 4.3 g/dL (ref 3.9–4.9)
Alkaline Phosphatase: 51 IU/L (ref 44–121)
Bilirubin Total: 0.5 mg/dL (ref 0.0–1.2)
Bilirubin, Direct: 0.13 mg/dL (ref 0.00–0.40)
Total Protein: 7.2 g/dL (ref 6.0–8.5)

## 2022-12-26 LAB — VITAMIN D 25 HYDROXY (VIT D DEFICIENCY, FRACTURES): Vit D, 25-Hydroxy: 20.3 ng/mL — ABNORMAL LOW (ref 30.0–100.0)

## 2022-12-26 LAB — TSH: TSH: 2.38 u[IU]/mL (ref 0.450–4.500)

## 2022-12-26 LAB — LIPID PANEL
Chol/HDL Ratio: 4.2 ratio (ref 0.0–4.4)
Cholesterol, Total: 168 mg/dL (ref 100–199)
HDL: 40 mg/dL (ref >39)
LDL Chol Calc (NIH): 114 mg/dL — ABNORMAL HIGH (ref 0–99)
Triglycerides: 71 mg/dL (ref 0–149)
VLDL Cholesterol Cal: 14 mg/dL (ref 5–40)

## 2022-12-26 LAB — HEMOGLOBIN A1C
Est. average glucose Bld gHb Est-mCnc: 117 mg/dL
Hgb A1c MFr Bld: 5.7 % — ABNORMAL HIGH (ref 4.8–5.6)

## 2022-12-29 ENCOUNTER — Encounter: Payer: Self-pay | Admitting: Internal Medicine

## 2022-12-29 DIAGNOSIS — Z9109 Other allergy status, other than to drugs and biological substances: Secondary | ICD-10-CM | POA: Insufficient documentation

## 2022-12-29 NOTE — Assessment & Plan Note (Signed)
Mammogram 11/15/22 - Birads I.

## 2022-12-29 NOTE — Assessment & Plan Note (Signed)
Reports chest pain with inclines.  Also intermittent increased heart racing. This mostly occurs when sitting.  EKG - SR/SB with no acute ischemic changes.  Given symptoms, do feel f/u with cardiology is warranted - question of need for further cardiac testing.

## 2022-12-29 NOTE — Assessment & Plan Note (Signed)
Significant issue with dysphagia as outlined.  Reports some issues with swallowing.  Previously required esophageal dilatation.  Food will get hung up. She has to stick her finger down her throat.  Had an episode - pill stuck.  Fluids would not go down.  Appears to be worsening.  Discussed eating slowly, taking small bites and chewing food well.  Discussed referral back to GI.

## 2022-12-29 NOTE — Assessment & Plan Note (Signed)
Continue vitamin D supplements.  

## 2022-12-29 NOTE — Assessment & Plan Note (Signed)
On thyroid replacement.  Follow tsh.  

## 2022-12-29 NOTE — Assessment & Plan Note (Signed)
Episodes of increased heart rate and outlined.  Occurs while sitting.  Last episode two weeks ago.  Given intermittent episodes and chest pain noted with inclines, will refer back to cardiology for further evaluation.

## 2022-12-29 NOTE — Assessment & Plan Note (Signed)
Zyrtec and steroid nasal spray as directed.  Follow. Call with update.

## 2022-12-29 NOTE — Assessment & Plan Note (Signed)
Colonoscopy 10/15/21 - colon polyp - ascending - tubular adenoma.  Recommended f/u in 5 years.   

## 2022-12-29 NOTE — Assessment & Plan Note (Signed)
Overall appears to be handling things well.  Follow.  Continue zoloft.  

## 2022-12-29 NOTE — Assessment & Plan Note (Addendum)
Discussed diet and exercise.  Off metformin. Follow.

## 2022-12-30 ENCOUNTER — Telehealth: Payer: Self-pay

## 2022-12-30 NOTE — Telephone Encounter (Signed)
-----   Message from Dale [place], MD sent at 12/27/2022  5:08 AM EDT ----- Notify - cholesterol levels relatively stable from last check. Improved form previous checks.  Overall sugar control increased some from last check.  Recommend a low carb diet and exercise.  We will follow.  Vitamin D level is low.  Need to confirm if taking vitamin D.  If not taking, then start vitamin D3 2000 units per day.  We will follow.  Hgb, thyroid test and liver function tests are wnl.

## 2022-12-30 NOTE — Telephone Encounter (Signed)
LMTCB. Ok to give results 

## 2022-12-30 NOTE — Telephone Encounter (Signed)
Pt called back and was given the results as stated okay to give.  Pt did state that she has not been taking her Vit-D but will start back taking them today at the recommendation of the provider 2000 units per day.

## 2023-02-05 ENCOUNTER — Other Ambulatory Visit: Payer: Self-pay | Admitting: Otolaryngology

## 2023-02-05 DIAGNOSIS — R09A2 Foreign body sensation, throat: Secondary | ICD-10-CM

## 2023-02-05 DIAGNOSIS — R1310 Dysphagia, unspecified: Secondary | ICD-10-CM

## 2023-03-26 ENCOUNTER — Ambulatory Visit: Payer: 59 | Admitting: Internal Medicine

## 2023-03-27 ENCOUNTER — Ambulatory Visit: Payer: 59 | Admitting: Internal Medicine

## 2023-03-27 VITALS — BP 118/68 | HR 60 | Temp 97.9°F | Resp 16 | Ht 70.0 in | Wt 176.0 lb

## 2023-03-27 DIAGNOSIS — R1310 Dysphagia, unspecified: Secondary | ICD-10-CM | POA: Diagnosis not present

## 2023-03-27 DIAGNOSIS — E039 Hypothyroidism, unspecified: Secondary | ICD-10-CM | POA: Diagnosis not present

## 2023-03-27 DIAGNOSIS — R739 Hyperglycemia, unspecified: Secondary | ICD-10-CM | POA: Diagnosis not present

## 2023-03-27 DIAGNOSIS — F439 Reaction to severe stress, unspecified: Secondary | ICD-10-CM | POA: Diagnosis not present

## 2023-03-27 DIAGNOSIS — Z86010 Personal history of colonic polyps: Secondary | ICD-10-CM

## 2023-03-27 NOTE — Progress Notes (Signed)
Subjective:    Patient ID: Meagan Calhoun, female    DOB: 1955-12-09, 67 y.o.   MRN: [id number]  Patient here for No chief complaint on file.   HPI Here to follow up regarding hyperglycemia and hypothyroidism. Reports she is doing relatively well. Handling stress.  Stress is better.  Stays active. Feels better after working outside.  No chest pain or sob reported.  No abdominal pain or bowel change reported.  Scheduled for swallowing evaluation tomorrow.     Past Medical History:  Diagnosis Date   Abnormal Pap smear of cervix    Anxiety    GERD (gastroesophageal reflux disease)    H/O jaundice    History of chicken pox    History of esophageal stricture 2012   Hx of colonic polyp    Hypothyroidism    Past Surgical History:  Procedure Laterality Date   BREAST BIOPSY Left    neg   CERVICAL BIOPSY  W/ LOOP ELECTRODE EXCISION  08/2013   chronic cervicitis   CHOLECYSTECTOMY  11/2006   Dr Renda Rolls   COLONOSCOPY  2012, 2017   normal   ESOPHAGOGASTRODUODENOSCOPY (EGD) WITH PROPOFOL N/A 06/22/2015   Procedure: ESOPHAGOGASTRODUODENOSCOPY (EGD) WITH PROPOFOL;  Surgeon: Scot Jun, MD;  Location: [facility] ENDOSCOPY;  Service: Endoscopy;  Laterality: N/A;   LAPAROTOMY  1988   ectopic pregnancy   SAVORY DILATION N/A 06/22/2015   Procedure: SAVORY DILATION;  Surgeon: Scot Jun, MD;  Location: [facility] ENDOSCOPY;  Service: Endoscopy;  Laterality: N/A;   thyroidectomy Right 2013   partial right thyroidectomy   TUBAL LIGATION     WISDOM TOOTH EXTRACTION     Family History  Problem Relation Age of Onset   Hypothyroidism Mother    Dementia Mother    Heart disease Father        CHF   Breast cancer Sister 26   Alzheimer's disease Sister    Heart attack Brother 30   Colon cancer Neg Hx    Social History   Socioeconomic History   Marital status: Married    Spouse name: Not on file   Number of children: 0   Years of education: 12   Highest education level: Not on file   Occupational History   Not on file  Tobacco Use   Smoking status: Never   Smokeless tobacco: Never  Vaping Use   Vaping status: Never Used  Substance and Sexual Activity   Alcohol use: Yes    Alcohol/week: 0.0 standard drinks of alcohol   Drug use: No   Sexual activity: Yes    Partners: Male    Birth control/protection: Post-menopausal  Other Topics Concern   Not on file  Social History Narrative   Not on file   Social Determinants of Health   Financial Resource Strain: Not on file  Food Insecurity: Not on file  Transportation Needs: Not on file  Physical Activity: Insufficiently Active (08/15/2017)   Exercise Vital Sign    Days of Exercise per Week: 4 days    Minutes of Exercise per Session: 30 min  Stress: No Stress Concern Present (08/15/2017)   [employer] of Occupational Health - Occupational Stress Questionnaire    Feeling of Stress : Not at all  Social Connections: Moderately Integrated (08/15/2017)   Social Connection and Isolation Panel [NHANES]    Frequency of Communication with Friends and Family: More than three times a week    Frequency of Social Gatherings with Friends and Family: Once a  week    Attends Religious Services: More than 4 times per year    Active Member of Clubs or Organizations: No    Attends Banker Meetings: Never    Marital Status: Married     Review of Systems  Constitutional:  Negative for appetite change and unexpected weight change.  HENT:  Negative for congestion and sinus pressure.   Respiratory:  Negative for cough, chest tightness and shortness of breath.   Cardiovascular:  Negative for chest pain and palpitations.  Gastrointestinal:  Negative for abdominal pain, diarrhea, nausea and vomiting.  Genitourinary:  Negative for difficulty urinating and dysuria.  Musculoskeletal:  Negative for joint swelling and myalgias.  Skin:  Negative for color change and rash.  Neurological:  Negative for dizziness and  headaches.  Psychiatric/Behavioral:  Negative for agitation and dysphoric mood.        Objective:     BP 118/68   Pulse 60   Temp 97.9 F (36.6 C)   Resp 16   Ht 5\' 10"  (1.778 m)   Wt 176 lb (79.8 kg)   SpO2 98%   BMI 25.25 kg/m  Wt Readings from Last 3 Encounters:  03/27/23 176 lb (79.8 kg)  12/25/22 178 lb (80.7 kg)  10/28/22 176 lb 9.6 oz (80.1 kg)    Physical Exam Vitals reviewed.  Constitutional:      General: She is not in acute distress.    Appearance: Normal appearance.  HENT:     Head: Normocephalic and atraumatic.     Right Ear: External ear normal.     Left Ear: External ear normal.  Eyes:     General: No scleral icterus.       Right eye: No discharge.        Left eye: No discharge.     Conjunctiva/sclera: Conjunctivae normal.  Neck:     Thyroid: No thyromegaly.  Cardiovascular:     Rate and Rhythm: Normal rate and regular rhythm.  Pulmonary:     Effort: No respiratory distress.     Breath sounds: Normal breath sounds. No wheezing.  Abdominal:     General: Bowel sounds are normal.     Palpations: Abdomen is soft.     Tenderness: There is no abdominal tenderness.  Musculoskeletal:        General: No swelling or tenderness.     Cervical back: Neck supple. No tenderness.  Lymphadenopathy:     Cervical: No cervical adenopathy.  Skin:    Findings: No erythema or rash.  Neurological:     Mental Status: She is alert.  Psychiatric:        Mood and Affect: Mood normal.        Behavior: Behavior normal.      Outpatient Encounter Medications as of 03/27/2023  Medication Sig   Biotin 10 MG CAPS Take by mouth.   Cyanocobalamin (VITAMIN B-12 PO) Take by mouth.   FOLIC ACID PO Take by mouth.   levothyroxine (SYNTHROID) 137 MCG tablet TAKE 1 TABLET(137 MCG) BY MOUTH DAILY BEFORE BREAKFAST   sertraline (ZOLOFT) 100 MG tablet TAKE 1 TABLET(100 MG) BY MOUTH DAILY   VITAMIN D PO Take by mouth.   No facility-administered encounter medications on file as  of 03/27/2023.     Lab Results  Component Value Date   WBC 3.8 12/25/2022   HGB 13.3 12/25/2022   HCT 40.9 12/25/2022   PLT 229 12/25/2022   GLUCOSE 90 12/25/2022   CHOL 168 12/25/2022  TRIG 71 12/25/2022   HDL 40 12/25/2022   LDLCALC 114 (H) 12/25/2022   ALT 16 12/25/2022   AST 14 12/25/2022   NA 141 12/25/2022   K 4.6 12/25/2022   CL 105 12/25/2022   CREATININE 0.95 12/25/2022   BUN 18 12/25/2022   CO2 24 12/25/2022   TSH 2.380 12/25/2022   HGBA1C 5.7 (H) 12/25/2022    MM 3D SCREEN BREAST BILATERAL  Result Date: 11/15/2022 CLINICAL DATA:  Screening. EXAM: DIGITAL SCREENING BILATERAL MAMMOGRAM WITH TOMOSYNTHESIS AND CAD TECHNIQUE: Bilateral screening digital craniocaudal and mediolateral oblique mammograms were obtained. Bilateral screening digital breast tomosynthesis was performed. The images were evaluated with computer-aided detection. COMPARISON:  Previous exam(s). ACR Breast Density Category c: The breasts are heterogeneously dense, which may obscure small masses. FINDINGS: There are no findings suspicious for malignancy. IMPRESSION: No mammographic evidence of malignancy. A result letter of this screening mammogram will be mailed directly to the patient. RECOMMENDATION: Screening mammogram in one year. (Code:SM-B-01Y) BI-RADS CATEGORY  1: Negative. Electronically Signed   By: Annia Belt M.D.   On: 11/15/2022 13:58       Assessment & Plan:  Dysphagia, unspecified type Assessment & Plan: Is s/p partial thyroidectomy.  Noticing difficulty swallowing.  Scheduled for swallowing evaluation tomorrow.    Stress Assessment & Plan: Overall appears to be handling things well.  Follow. Continue zoloft.   Hypothyroidism, unspecified type Assessment & Plan: On thyroid replacement.  Follow tsh.    Hyperglycemia Assessment & Plan: Discussed diet and exercise.  Off metformin. Follow.    History of colonic polyps Assessment & Plan: Colonoscopy 10/15/21 - colon polyp -  ascending - tubular adenoma.  Recommended f/u in 5 years.        [place], MD

## 2023-03-28 ENCOUNTER — Ambulatory Visit
Admission: RE | Admit: 2023-03-28 | Discharge: 2023-03-28 | Disposition: A | Discharge status: Home / Self Care | Payer: 59 | Source: Ambulatory Visit | Attending: Otolaryngology | Admitting: Otolaryngology

## 2023-03-28 DIAGNOSIS — R1310 Dysphagia, unspecified: Secondary | ICD-10-CM | POA: Diagnosis present

## 2023-03-28 DIAGNOSIS — R09A2 Foreign body sensation, throat: Secondary | ICD-10-CM | POA: Insufficient documentation

## 2023-03-30 NOTE — Procedures (Signed)
Modified Barium Swallow Study  Patient Details  Name: Meagan Calhoun MRN: [id number] Date of Birth: 1956-05-20  Today's Date: 03/30/2023  Modified Barium Swallow completed.  Full report located under Chart Review in the Imaging Section.  History of Present Illness Pt is a 67 year old female who was referred by multiple physicians - [name] (PCP) and [name] (ENT) on 02/03/2023 for globus sensation. Pt with significant long-standing history of esophageal dysfunction.        Barium Swallow 2012 revealed there is a very small reducible hiatal hernia. There is a moderate amount of gastroesophageal reflux. I do not see evidence of esophagitis or  stricture. 2. The stomach and duodenum are normal in appearance.      Chart review reveals upper GI on 06/22/2015 by [name] for low-grade narrowing and mild Schatzki ring (acquired) was found in the upper third of the esophagus. Dilation was performed with Savary dilator with mild resistance at 16mm and 17mm. Chart also indicates a second endoscopy in 2018 by [name] but report not located in [facility].   Clinical Impression Currently pt reports that food gets stuck (pointing to laryngeal prominence of thyroid cartilage). She reports consuming primarily pasta, mashed potatoes and consistently needing to stick her finger down her throat to bring food back up. She is not currently taking any reflux medicines. Had a recent GI appt with [name]. Waiting on results of this study before planning an GI interventions.       Pt presents with continuous throat clearing prior to any PO intake. She ocntinues with throat clear throughout study but appears unrelated to any airway compromise as she presents with adequate oropharyngeal abilities when consuming puree, graham crackers with barium paste, thin liquids via cup and nectar thick liquids. She presents with adequate oropharyngeal abilities when consuming whole barium tablet with thin  liquids. HOWEVER, the barium tablet became lodged in pt's upper esophagus on what might be suspicious for a Tour manager (radiologist to confirm). Barium tablet remained lodged despite consuming copious amounts of thin and nectar thick liquids, applesauce, chin tucks, head turns and pt trying to induce gag reflex with her finger and hard involuntary throat clears. Pt attempted to push on right side of her neck during swallow as "this sometimes helps." All efforts were not successful for > 40 minutes. At that time pt report decreased globus senssation and was able to leave radiology suite. While fluro didn't confirm, it is likely that globus sensation disapated as pill disolved. Secure chat sent to pt's PCP, ENT and GI on possibility of stat appt for EGD.   Factors that may increase risk of adverse event in presence of aspiration [name] & Clearance [name] 2021):  esophageal dysfunction  Swallow Evaluation Recommendations Recommendations: PO diet PO Diet Recommendation: Dysphagia 2 (Finely chopped);Dysphagia 3 (Mechanical soft);Thin liquids (Level 0) Liquid Administration via: Straw;Cup Medication Administration: Crushed with puree Supervision: Patient able to self-feed Swallowing strategies  : Minimize environmental distractions;Slow rate;Small bites/sips Postural changes: Position pt fully upright for meals;Stay upright 30-60 min after meals Oral care recommendations: Oral care BID (2x/day) Recommended consults: Consider GI consultation;Consider esophageal assessment    [name] B. [name], M.S., CCC-SLP, Tree surgeon Certified Brain Injury Specialist [facility]  [facility] Office [phone] Ascom [phone] Fax [phone]

## 2023-03-31 ENCOUNTER — Encounter: Payer: Self-pay | Admitting: Internal Medicine

## 2023-03-31 NOTE — Assessment & Plan Note (Signed)
Is s/p partial thyroidectomy.  Noticing difficulty swallowing.  Scheduled for swallowing evaluation tomorrow.

## 2023-03-31 NOTE — Assessment & Plan Note (Signed)
Colonoscopy 10/15/21 - colon polyp - ascending - tubular adenoma.  Recommended f/u in 5 years.   

## 2023-03-31 NOTE — Assessment & Plan Note (Signed)
Discussed diet and exercise.  Off metformin. Follow.

## 2023-03-31 NOTE — Assessment & Plan Note (Signed)
Overall appears to be handling things well.  Follow.  Continue zoloft.  

## 2023-03-31 NOTE — Assessment & Plan Note (Signed)
On thyroid replacement.  Follow tsh.  

## 2023-04-18 ENCOUNTER — Ambulatory Visit: Payer: 59

## 2023-04-18 DIAGNOSIS — R933 Abnormal findings on diagnostic imaging of other parts of digestive tract: Secondary | ICD-10-CM | POA: Diagnosis not present

## 2023-04-18 DIAGNOSIS — K222 Esophageal obstruction: Secondary | ICD-10-CM | POA: Diagnosis not present

## 2023-04-18 DIAGNOSIS — R1313 Dysphagia, pharyngeal phase: Secondary | ICD-10-CM

## 2023-05-26 ENCOUNTER — Other Ambulatory Visit: Payer: Self-pay | Admitting: Internal Medicine

## 2023-07-06 ENCOUNTER — Other Ambulatory Visit: Payer: Self-pay | Admitting: Internal Medicine

## 2023-07-30 ENCOUNTER — Ambulatory Visit: Payer: 59 | Admitting: Internal Medicine

## 2023-07-30 VITALS — BP 106/70 | HR 64 | Temp 98.0°F | Resp 16 | Ht 70.0 in | Wt 174.6 lb

## 2023-07-30 DIAGNOSIS — R1310 Dysphagia, unspecified: Secondary | ICD-10-CM

## 2023-07-30 DIAGNOSIS — E039 Hypothyroidism, unspecified: Secondary | ICD-10-CM | POA: Diagnosis not present

## 2023-07-30 DIAGNOSIS — R739 Hyperglycemia, unspecified: Secondary | ICD-10-CM | POA: Diagnosis not present

## 2023-07-30 DIAGNOSIS — Z0000 Encounter for general adult medical examination without abnormal findings: Secondary | ICD-10-CM | POA: Diagnosis not present

## 2023-07-30 DIAGNOSIS — E559 Vitamin D deficiency, unspecified: Secondary | ICD-10-CM | POA: Diagnosis not present

## 2023-07-30 DIAGNOSIS — F439 Reaction to severe stress, unspecified: Secondary | ICD-10-CM

## 2023-07-30 DIAGNOSIS — R87619 Unspecified abnormal cytological findings in specimens from cervix uteri: Secondary | ICD-10-CM

## 2023-07-30 DIAGNOSIS — Z860100 Personal history of colon polyps, unspecified: Secondary | ICD-10-CM

## 2023-07-30 DIAGNOSIS — Z1231 Encounter for screening mammogram for malignant neoplasm of breast: Secondary | ICD-10-CM

## 2023-07-30 NOTE — Assessment & Plan Note (Signed)
Physical today 07/30/23.  Mammogram 11/13/22 - Birads I.  Colonoscopy 09/2021 - recommended f/u in 5 years. Sees gyn for pelvic/pap smears. PAP 09/2020 - ok.

## 2023-07-30 NOTE — Progress Notes (Signed)
Subjective:    Patient ID: Meagan Calhoun, female    DOB: [birth year], 67 y.o.   MRN: [id number]  Patient here for  Chief Complaint  Patient presents with   Annual Exam    HPI Here for a physical exam. Has been seeing GI - for dysphagia. Per review, declined repeat EGD with balloon dilatation. Looking into other options. Reports just discussed with Dr Norma Fredrickson and is planning - TruFreeze procedure. Continues on zoloft. Handling stress.  Overall she feels she is doing relatively well.  Stays active.  No chest pain or sob reported.  No abdominal pain reported.     Past Medical History:  Diagnosis Date   Abnormal Pap smear of cervix    Anxiety    GERD (gastroesophageal reflux disease)    H/O jaundice    History of chicken pox    History of esophageal stricture 2012   Hx of colonic polyp    Hypothyroidism    Past Surgical History:  Procedure Laterality Date   BREAST BIOPSY Left    neg   CERVICAL BIOPSY  W/ LOOP ELECTRODE EXCISION  08/2013   chronic cervicitis   CHOLECYSTECTOMY  11/2006   Dr Renda Rolls   COLONOSCOPY  2012, 2017   normal   ESOPHAGOGASTRODUODENOSCOPY (EGD) WITH PROPOFOL N/A 06/22/2015   Procedure: ESOPHAGOGASTRODUODENOSCOPY (EGD) WITH PROPOFOL;  Surgeon: Scot Jun, MD;  Location[facility] ENDOSCOPY;  Service: Endoscopy;  Laterality: N/A;   LAPAROTOMY  1988   ectopic pregnancy   SAVORY DILATION N/A 06/22/2015   Procedure: SAVORY DILATION;  Surgeon: Scot Jun, MD;  Location: [facility] ENDOSCOPY;  Service: Endoscopy;  Laterality: N/A;   thyroidectomy Right 2013   partial right thyroidectomy   TUBAL LIGATION     WISDOM TOOTH EXTRACTION     Family History  Problem Relation Age of Onset   Hypothyroidism Mother    Dementia Mother    Heart disease Father        CHF   Breast cancer Sister 71   Alzheimer's disease Sister    Heart attack Brother 12   Colon cancer Neg Hx    Social History   Socioeconomic History   Marital status: Married    Spouse  name: Not on file   Number of children: 0   Years of education: 12   Highest education level: Not on file  Occupational History   Not on file  Tobacco Use   Smoking status: Never   Smokeless tobacco: Never  Vaping Use   Vaping status: Never Used  Substance and Sexual Activity   Alcohol use: Yes    Alcohol/week: 0.0 standard drinks of alcohol   Drug use: No   Sexual activity: Yes    Partners: Male    Birth control/protection: Post-menopausal  Other Topics Concern   Not on file  Social History Narrative   Not on file   Social Determinants of Health   Financial Resource Strain: Not on file  Food Insecurity: Not on file  Transportation Needs: Not on file  Physical Activity: Insufficiently Active (08/15/2017)   Exercise Vital Sign    Days of Exercise per Week: 4 days    Minutes of Exercise per Session: 30 min  Stress: No Stress Concern Present (08/15/2017)   [employer] of Occupational Health - Occupational Stress Questionnaire    Feeling of Stress : Not at all  Social Connections: Moderately Integrated (08/15/2017)   Social Connection and Isolation Panel [NHANES]    Frequency of Communication  with Friends and Family: More than three times a week    Frequency of Social Gatherings with Friends and Family: Once a week    Attends Religious Services: More than 4 times per year    Active Member of [employer] or Organizations: No    Attends Banker Meetings: Never    Marital Status: Married     Review of Systems  Constitutional:  Negative for appetite change and unexpected weight change.  HENT:  Negative for congestion, sinus pressure and sore throat.   Eyes:  Negative for pain and visual disturbance.  Respiratory:  Negative for cough, chest tightness and shortness of breath.   Cardiovascular:  Negative for chest pain, palpitations and leg swelling.  Gastrointestinal:  Negative for abdominal pain, diarrhea, nausea and vomiting.       Dysphagia.    Genitourinary:  Negative for difficulty urinating and dysuria.  Musculoskeletal:  Negative for joint swelling and myalgias.  Skin:  Negative for color change and rash.  Neurological:  Negative for dizziness and headaches.  Hematological:  Negative for adenopathy. Does not bruise/bleed easily.  Psychiatric/Behavioral:  Negative for agitation and dysphoric mood.        Objective:     BP 106/70   Pulse 64   Temp 98 F (36.7 C)   Resp 16   Ht 5\' 10"  (1.778 m)   Wt 174 lb 9.6 oz (79.2 kg)   SpO2 97%   BMI 25.05 kg/m  Wt Readings from Last 3 Encounters:  07/30/23 174 lb 9.6 oz (79.2 kg)  03/27/23 176 lb (79.8 kg)  12/25/22 178 lb (80.7 kg)    Physical Exam Vitals reviewed.  Constitutional:      General: She is not in acute distress.    Appearance: Normal appearance.  HENT:     Head: Normocephalic and atraumatic.     Right Ear: External ear normal.     Left Ear: External ear normal.  Eyes:     General: No scleral icterus.       Right eye: No discharge.        Left eye: No discharge.     Conjunctiva/sclera: Conjunctivae normal.  Neck:     Thyroid: No thyromegaly.  Cardiovascular:     Rate and Rhythm: Normal rate and regular rhythm.  Pulmonary:     Effort: No respiratory distress.     Breath sounds: Normal breath sounds. No wheezing.  Abdominal:     General: Bowel sounds are normal.     Palpations: Abdomen is soft.     Tenderness: There is no abdominal tenderness.  Genitourinary:    Comments: Sees gyn.  Musculoskeletal:        General: No swelling or tenderness.     Cervical back: Neck supple. No tenderness.  Lymphadenopathy:     Cervical: No cervical adenopathy.  Skin:    Findings: No erythema or rash.  Neurological:     Mental Status: She is alert.  Psychiatric:        Mood and Affect: Mood normal.        Behavior: Behavior normal.      Outpatient Encounter Medications as of 07/30/2023  Medication Sig   Biotin 10 MG CAPS Take by mouth.    Cyanocobalamin (VITAMIN B-12 PO) Take by mouth.   FOLIC ACID PO Take by mouth.   levothyroxine (SYNTHROID) 137 MCG tablet TAKE 1 TABLET(137 MCG) BY MOUTH DAILY BEFORE BREAKFAST   sertraline (ZOLOFT) 100 MG tablet TAKE 1 TABLET(100 MG)  BY MOUTH DAILY   VITAMIN D PO Take by mouth.   No facility-administered encounter medications on file as of 07/30/2023.     Lab Results  Component Value Date   WBC 3.8 12/25/2022   HGB 13.3 12/25/2022   HCT 40.9 12/25/2022   PLT 229 12/25/2022   GLUCOSE 83 07/30/2023   CHOL 180 07/30/2023   TRIG 151 (H) 07/30/2023   HDL 36 (L) 07/30/2023   LDLCALC 117 (H) 07/30/2023   ALT 11 07/30/2023   AST 16 07/30/2023   NA 140 07/30/2023   K 4.4 07/30/2023   CL 103 07/30/2023   CREATININE 0.93 07/30/2023   BUN 17 07/30/2023   CO2 23 07/30/2023   TSH 2.380 12/25/2022   HGBA1C 5.6 07/30/2023    DG SWALLOW FUNC OP MEDICARE SPEECH PATH  Addendum Date: 04/10/2023   ADDENDUM REPORT: 04/02/2023 08:02 ADDENDUM: Barium tablet failed to pass the upper esophagus. Shoulders like indentation along the anterior margin esophagus at the C4-C5 vertebral body level suggestive of esophageal web. Presumed esophageal web high cervical esophagus prevents passage of the barium tablet. Electronically Signed   By: Genevive Bi M.D.   On: 04/02/2023 08:02   Result Date: 04/02/2023 CLINICAL DATA:  Dysphagia. EXAM: MODIFIED BARIUM SWALLOW TECHNIQUE: Different consistencies of barium were administered orally to the patient by the Speech Pathologist. Imaging of the pharynx was performed in the lateral projection. FLUOROSCOPY TIME:  Radiation Exposure Index (as provided by the fluoroscopic device): 2.1 minutes (15 mGy) COMPARISON:  None Available. FINDINGS: Modified barium swallow was performed by the speech pathologist. Radiologist was not involved with this exam. Please refer to the Speech Pathology report for results and recommendations. IMPRESSION: Please refer to the Speech  Pathologists report for complete details and recommendations. Electronically Signed: By: Olive Bass M.D. On: 03/31/2023 09:52       Assessment & Plan:  Hyperglycemia Assessment & Plan: Follow met b and A1c.   Orders: -     Hemoglobin A1c -     Basic metabolic panel  Hypothyroidism, unspecified type Assessment & Plan: On thyroid replacement.  Follow tsh.   Orders: -     Hepatic function panel -     Lipid panel  Vitamin D deficiency -     VITAMIN D 25 Hydroxy (Vit-D Deficiency, Fractures)  Visit for screening mammogram -     3D Screening Mammogram, Left and Right; Future  Health care maintenance Assessment & Plan: Physical today 07/30/23.  Mammogram 11/13/22 - Birads I.  Colonoscopy 09/2021 - recommended f/u in 5 years. Sees gyn for pelvic/pap smears. PAP 09/2020 - ok.    Abnormal cervical Papanicolaou smear, unspecified abnormal pap finding Assessment & Plan: PAP 09/2020 - ok.  Negative with negative HPV - [facility].    Dysphagia, unspecified type Assessment & Plan: Is s/p partial thyroidectomy.  Has seen GI.  Planning for TruFreez procedure.  Continue small bites. Chew food well.  Eat slowly.     History of colonic polyps Assessment & Plan: Colonoscopy 10/15/21 - colon polyp - ascending - tubular adenoma.  Recommended f/u in 5 years.     Stress Assessment & Plan: Overall appears to be handling things well.  Follow. Continue zoloft.      Dale Fajardo, MD

## 2023-07-31 LAB — HEMOGLOBIN A1C
Est. average glucose Bld gHb Est-mCnc: 114 mg/dL
Hgb A1c MFr Bld: 5.6 % (ref 4.8–5.6)

## 2023-07-31 LAB — LIPID PANEL
Chol/HDL Ratio: 5 ratio — ABNORMAL HIGH (ref 0.0–4.4)
Cholesterol, Total: 180 mg/dL (ref 100–199)
HDL: 36 mg/dL — ABNORMAL LOW (ref >39)
LDL Chol Calc (NIH): 117 mg/dL — ABNORMAL HIGH (ref 0–99)
Triglycerides: 151 mg/dL — ABNORMAL HIGH (ref 0–149)
VLDL Cholesterol Cal: 27 mg/dL (ref 5–40)

## 2023-07-31 LAB — BASIC METABOLIC PANEL
BUN/Creatinine Ratio: 18 (ref 12–28)
BUN: 17 mg/dL (ref 8–27)
CO2: 23 mmol/L (ref 20–29)
Calcium: 9.6 mg/dL (ref 8.7–10.3)
Chloride: 103 mmol/L (ref 96–106)
Creatinine, Ser: 0.93 mg/dL (ref 0.57–1.00)
Glucose: 83 mg/dL (ref 70–99)
Potassium: 4.4 mmol/L (ref 3.5–5.2)
Sodium: 140 mmol/L (ref 134–144)
eGFR: 68 mL/min/{1.73_m2} (ref >59)

## 2023-07-31 LAB — HEPATIC FUNCTION PANEL
ALT: 11 [IU]/L (ref 0–32)
AST: 16 [IU]/L (ref 0–40)
Albumin: 4.2 g/dL (ref 3.9–4.9)
Alkaline Phosphatase: 56 [IU]/L (ref 44–121)
Bilirubin Total: 0.3 mg/dL (ref 0.0–1.2)
Bilirubin, Direct: 0.12 mg/dL (ref 0.00–0.40)
Total Protein: 7 g/dL (ref 6.0–8.5)

## 2023-07-31 LAB — VITAMIN D 25 HYDROXY (VIT D DEFICIENCY, FRACTURES): Vit D, 25-Hydroxy: 29.5 ng/mL — ABNORMAL LOW (ref 30.0–100.0)

## 2023-08-03 ENCOUNTER — Encounter: Payer: Self-pay | Admitting: Internal Medicine

## 2023-08-03 NOTE — Assessment & Plan Note (Signed)
Overall appears to be handling things well.  Follow.  Continue zoloft.  

## 2023-08-03 NOTE — Assessment & Plan Note (Signed)
Follow met b and A1c.  

## 2023-08-03 NOTE — Assessment & Plan Note (Signed)
Colonoscopy 10/15/21 - colon polyp - ascending - tubular adenoma.  Recommended f/u in 5 years.   

## 2023-08-03 NOTE — Assessment & Plan Note (Signed)
On thyroid replacement.  Follow tsh.  

## 2023-08-03 NOTE — Assessment & Plan Note (Signed)
Is s/p partial thyroidectomy.  Has seen GI.  Planning for TruFreez procedure.  Continue small bites. Chew food well.  Eat slowly.

## 2023-08-03 NOTE — Assessment & Plan Note (Signed)
PAP 09/2020 - ok.  Negative with negative HPV - [facility].

## 2023-08-19 ENCOUNTER — Encounter: Payer: Self-pay | Admitting: Internal Medicine

## 2023-08-20 ENCOUNTER — Encounter
Admission: RE | Disposition: A | Discharge status: Home / Self Care | Payer: Self-pay | Source: Home / Self Care | Attending: Internal Medicine

## 2023-08-20 ENCOUNTER — Ambulatory Visit
Admission: RE | Admit: 2023-08-20 | Discharge: 2023-08-20 | Disposition: A | Discharge status: Home / Self Care | Payer: 59 | Attending: Internal Medicine | Admitting: Internal Medicine

## 2023-08-20 ENCOUNTER — Ambulatory Visit: Payer: 59 | Admitting: Certified Registered"

## 2023-08-20 ENCOUNTER — Ambulatory Visit: Payer: 59

## 2023-08-20 ENCOUNTER — Encounter: Payer: Self-pay | Admitting: Internal Medicine

## 2023-08-20 DIAGNOSIS — K219 Gastro-esophageal reflux disease without esophagitis: Secondary | ICD-10-CM | POA: Insufficient documentation

## 2023-08-20 DIAGNOSIS — E039 Hypothyroidism, unspecified: Secondary | ICD-10-CM | POA: Diagnosis not present

## 2023-08-20 DIAGNOSIS — Z79890 Hormone replacement therapy (postmenopausal): Secondary | ICD-10-CM | POA: Insufficient documentation

## 2023-08-20 DIAGNOSIS — K222 Esophageal obstruction: Secondary | ICD-10-CM | POA: Insufficient documentation

## 2023-08-20 DIAGNOSIS — R1313 Dysphagia, pharyngeal phase: Secondary | ICD-10-CM | POA: Insufficient documentation

## 2023-08-20 DIAGNOSIS — R1314 Dysphagia, pharyngoesophageal phase: Secondary | ICD-10-CM | POA: Diagnosis not present

## 2023-08-20 DIAGNOSIS — F419 Anxiety disorder, unspecified: Secondary | ICD-10-CM | POA: Insufficient documentation

## 2023-08-20 DIAGNOSIS — Z79899 Other long term (current) drug therapy: Secondary | ICD-10-CM | POA: Insufficient documentation

## 2023-08-20 DIAGNOSIS — K2289 Other specified disease of esophagus: Secondary | ICD-10-CM | POA: Insufficient documentation

## 2023-08-20 HISTORY — PX: BIOPSY: SHX5522

## 2023-08-20 HISTORY — PX: ESOPHAGOGASTRODUODENOSCOPY (EGD) WITH PROPOFOL: SHX5813

## 2023-08-20 SURGERY — ESOPHAGOGASTRODUODENOSCOPY (EGD) WITH PROPOFOL
Anesthesia: General

## 2023-08-20 MED ORDER — ONDANSETRON HCL 4 MG/2ML IJ SOLN
4.0000 mg | Freq: Once | INTRAMUSCULAR | Status: AC
  Administered 2023-08-20: 4 mg via INTRAVENOUS

## 2023-08-20 MED ORDER — PROPOFOL 500 MG/50ML IV EMUL
INTRAVENOUS | Status: DC | PRN
  Administered 2023-08-20: 150 ug/kg/min via INTRAVENOUS

## 2023-08-20 MED ORDER — SODIUM CHLORIDE 0.9 % IV SOLN: INTRAVENOUS | Status: DC

## 2023-08-20 MED ORDER — ONDANSETRON HCL 4 MG/2ML IJ SOLN
INTRAMUSCULAR | Status: AC
  Filled 2023-08-20: qty 2

## 2023-08-20 MED ORDER — PROPOFOL 10 MG/ML IV BOLUS
INTRAVENOUS | Status: DC | PRN
Start: 2023-08-20 — End: 2023-08-20
  Administered 2023-08-20: 20 mg via INTRAVENOUS
  Administered 2023-08-20: 50 mg via INTRAVENOUS

## 2023-08-20 NOTE — Anesthesia Procedure Notes (Signed)
Procedure Name: MAC Date/Time: 08/20/2023 11:18 AM  Performed by: Nelle Don, CRNAPre-anesthesia Checklist: Patient identified, Emergency Drugs available, Suction available and Patient being monitored Oxygen Delivery Method: Simple face mask

## 2023-08-20 NOTE — OR Nursing (Signed)
Chest xray obtained

## 2023-08-20 NOTE — Transfer of Care (Signed)
Immediate Anesthesia Transfer of Care Note  Patient: Meagan Calhoun  Procedure(s) Performed: ESOPHAGOGASTRODUODENOSCOPY (EGD) WITH PROPOFOL BIOPSY Balloon dilation wire-guided  Patient Location: PACU  Anesthesia Type:General  Level of Consciousness: drowsy  Airway & Oxygen Therapy: Patient Spontanous Breathing and Patient connected to face mask oxygen  Post-op Assessment: Report given to RN, Post -op Vital signs reviewed and stable, and Patient moving all extremities X 4  Post vital signs: Reviewed and stable  Last Vitals:  Vitals Value Taken Time  BP 117/75   Temp    Pulse 61   Resp 12   SpO2 100     Last Pain:  Vitals:   08/20/23 1014  TempSrc: Temporal         Complications: No notable events documented.

## 2023-08-20 NOTE — OR Nursing (Signed)
Patient c/o chest and abdominal pain. Husband was brought in and helped her walk to the bathroom. Patient passed a lot of gas and had a bowel movement. Continued to pass gas and is now feeling much better.

## 2023-08-20 NOTE — Anesthesia Postprocedure Evaluation (Signed)
Anesthesia Post Note  Patient: Meagan Calhoun  Procedure(s) Performed: ESOPHAGOGASTRODUODENOSCOPY (EGD) WITH PROPOFOL BIOPSY Balloon dilation wire-guided  Patient location during evaluation: Endoscopy Anesthesia Type: General Level of consciousness: awake and alert Pain management: pain level controlled Vital Signs Assessment: post-procedure vital signs reviewed and stable Respiratory status: spontaneous breathing, nonlabored ventilation, respiratory function stable and patient connected to nasal cannula oxygen Cardiovascular status: blood pressure returned to baseline and stable Postop Assessment: no apparent nausea or vomiting Anesthetic complications: no   No notable events documented.   Last Vitals:  Vitals:   08/20/23 1237 08/20/23 1247  BP: (!) 138/90 125/66  Pulse: (!) 56 78  Resp:    Temp:    SpO2: 98% 98%    Last Pain:  Vitals:   08/20/23 1237  TempSrc:   PainSc: 8                  Corinda Gubler

## 2023-08-20 NOTE — Interval H&P Note (Signed)
History and Physical Interval Note:  08/20/2023 10:05 AM  Meagan Calhoun  has presented today for surgery, with the diagnosis of Cricopharyngeal Web Dysphagia CryoFreeze.  The various methods of treatment have been discussed with the patient and family. After consideration of risks, benefits and other options for treatment, the patient has consented to  Procedure(s) with comments: ESOPHAGOGASTRODUODENOSCOPY (EGD) WITH PROPOFOL (N/A) - Cryotherapy Procedure (1 hour per Dr Norma Fredrickson) as a surgical intervention.  The patient's history has been reviewed, patient examined, no change in status, stable for surgery.  I have reviewed the patient's chart and labs.  Questions were answered to the patient's satisfaction.     [place], [place]

## 2023-08-20 NOTE — Interval H&P Note (Signed)
History and Physical Interval Note:  08/20/2023 10:03 AM  Meagan Calhoun  has presented today for surgery, with the diagnosis of Cricopharyngeal Web Dysphagia EGD CryoFreeze followed by esophageal dilation.  The various methods of treatment have been discussed with the patient and family. After consideration of risks, benefits and other options for treatment, the patient has consented to  Procedure(s) with comments: ESOPHAGOGASTRODUODENOSCOPY (EGD) WITH PROPOFOL (N/A) - Cryotherapy Procedure (1 hour per Dr Norma Fredrickson) as a surgical intervention.  The patient's history has been reviewed, patient examined, no change in status, stable for surgery.  I have reviewed the patient's chart and labs.  Questions were answered to the patient's satisfaction.     [place], [place]

## 2023-08-20 NOTE — Interval H&P Note (Signed)
History and Physical Interval Note:  08/20/2023 10:04 AM  Meagan Calhoun  has presented today for surgery, with the diagnosis of Cricopharyngeal Web Dysphagia CryoFreeze.  The various methods of treatment have been discussed with the patient and family. After consideration of risks, benefits and other options for treatment, the patient has consented to  Procedure(s) with comments: ESOPHAGOGASTRODUODENOSCOPY (EGD) WITH PROPOFOL (N/A) - Cryotherapy Procedure (1 hour per Dr Norma Fredrickson) as a surgical intervention.  The patient's history has been reviewed, patient examined, no change in status, stable for surgery.  I have reviewed the patient's chart and labs.  Questions were answered to the patient's satisfaction.     [place], [place]

## 2023-08-20 NOTE — H&P (Signed)
Outpatient short stay form Pre-procedure 08/20/2023 9:58 AM Meagan Calhoun K. Meagan Calhoun, M.D.  Primary Physician: Dale [place], M.D.  Reason for visit:  Recurrent pharyngeal dysphagia, upper esophageal/cricopharyngeal stricture.  History of present illness:  [name] presents for clinical follow-up of dysphagia related to upper esophageal stricture/cricopharyngeal stricture. Patient has noticed only several days relief from her last esophageal dilatation. A couple of weeks ago she is for started noticing food like bread and tortilla chips getting stuck in the base of the throat/sternal notch region and had to regurgitated on 2 separate occasions. She wants a longer lasting more durable treatment for strictures if 1 can be found. No hematemesis or abdominal pain. . She has undergone numerous endoscopies in the past in 2016 and 2018 by Dr. Mechele Calhoun at which time she had esophageal dilatation performed of ring strictures. The largest dilator passed, however, was 17 mm and 51 [place]. Patient has intermittent issues with "choking" and she localizes this to the area in the cricothyroid region of her neck well above the sternal notch.  At the patient's last endoscopy on 04/22/2023 the patient had an upper esophageal/cricopharyngeal web which was dilated with a TTS balloon to 12 mm.  This resulted in only short-term relief of dysphagia.  She presents today for cryo dilation of the web.  No current facility-administered medications for this encounter.  Medications Prior to Admission  Medication Sig Dispense Refill Last Dose   Biotin 10 MG CAPS Take by mouth.      Cyanocobalamin (VITAMIN B-12 PO) Take by mouth.      FOLIC ACID PO Take by mouth.      levothyroxine (SYNTHROID) 137 MCG tablet TAKE 1 TABLET(137 MCG) BY MOUTH DAILY BEFORE BREAKFAST 90 tablet 1    sertraline (ZOLOFT) 100 MG tablet TAKE 1 TABLET(100 MG) BY MOUTH DAILY 90 tablet 1    VITAMIN D PO Take by mouth.        No Known Allergies   Past  Medical History:  Diagnosis Date   Abnormal Pap smear of cervix    Anxiety    GERD (gastroesophageal reflux disease)    H/O jaundice    History of chicken pox    History of esophageal stricture 2012   Hx of colonic polyp    Hypothyroidism     Review of systems:  Otherwise negative.    Physical Exam  Gen: Alert, oriented. Appears stated age.  HEENT: [place]/AT. PERRLA. Lungs: CTA, no wheezes. CV: RR nl S1, S2. Abd: soft, benign, no masses. BS+ Ext: No edema. Pulses 2+    Planned procedures: Proceed with EGD with cryo dilation utilizing true freeze liquid nitrogen therapy.. The patient understands the nature of the planned procedure, indications, risks, alternatives and potential complications including but not limited to bleeding, infection, perforation, damage to internal organs and possible oversedation/side effects from anesthesia. The patient agrees and gives consent to proceed.  Please refer to procedure notes for findings, recommendations and patient disposition/instructions.     Meagan Calhoun K. Meagan Calhoun, M.D. Gastroenterology 08/20/2023  9:58 AM

## 2023-08-20 NOTE — Anesthesia Preprocedure Evaluation (Signed)
Anesthesia Evaluation  Patient identified by MRN, date of birth, ID band Patient awake    Reviewed: Allergy & Precautions, NPO status , Patient's Chart, lab work & pertinent test results  History of Anesthesia Complications Negative for: history of anesthetic complications  Airway Mallampati: II  TM Distance: >3 FB Neck ROM: Full    Dental no notable dental hx. (+) Teeth Intact   Pulmonary neg pulmonary ROS, neg sleep apnea, neg COPD, Patient abstained from smoking.Not current smoker   Pulmonary exam normal breath sounds clear to auscultation       Cardiovascular Exercise Tolerance: Good METS(-) hypertension(-) CAD and (-) Past MI negative cardio ROS (-) dysrhythmias  Rhythm:Regular Rate:Normal - Systolic murmurs    Neuro/Psych  PSYCHIATRIC DISORDERS Anxiety     negative neurological ROS     GI/Hepatic ,GERD  Medicated,,(+)     (-) substance abuse    Endo/Other  neg diabetesHypothyroidism    Renal/GU negative Renal ROS     Musculoskeletal   Abdominal   Peds  Hematology   Anesthesia Other Findings Past Medical History: No date: Abnormal Pap smear of cervix No date: Anxiety No date: GERD (gastroesophageal reflux disease) No date: H/O jaundice No date: History of chicken pox 2012: History of esophageal stricture No date: Hx of colonic polyp No date: Hypothyroidism  Reproductive/Obstetrics                             Anesthesia Physical Anesthesia Plan  ASA: 2  Anesthesia Plan: General   Post-op Pain Management: Minimal or no pain anticipated   Induction: Intravenous  PONV Risk Score and Plan: 3 and Propofol infusion, TIVA and Ondansetron  Airway Management Planned: Nasal Cannula  Additional Equipment: None  Intra-op Plan:   Post-operative Plan:   Informed Consent: I have reviewed the patients History and Physical, chart, labs and discussed the procedure including the  risks, benefits and alternatives for the proposed anesthesia with the patient or authorized representative who has indicated his/her understanding and acceptance.     Dental advisory given  Plan Discussed with: CRNA and Surgeon  Anesthesia Plan Comments: (Discussed risks of anesthesia with patient, including possibility of difficulty with spontaneous ventilation under anesthesia necessitating airway intervention, PONV, and rare risks such as cardiac or respiratory or neurological events, and allergic reactions. Discussed the role of CRNA in patient's perioperative care. Patient understands.)       Anesthesia Quick Evaluation

## 2023-08-20 NOTE — Interval H&P Note (Signed)
History and Physical Interval Note:  08/20/2023 10:03 AM  Meagan Calhoun  has presented today for surgery, with the diagnosis of Cricopharyngeal Web Dysphagia Esophagogastroduodenoscopy CryoFreeze and esophageal dilation with or without biopsy.  The various methods of treatment have been discussed with the patient and family. After consideration of risks, benefits and other options for treatment, the patient has consented to  Procedure(s) with comments: ESOPHAGOGASTRODUODENOSCOPY (EGD) WITH PROPOFOL (N/A) - Cryotherapy Procedure (1 hour per Dr Norma Fredrickson) as a surgical intervention.  The patient's history has been reviewed, patient examined, no change in status, stable for surgery.  I have reviewed the patient's chart and labs.  Questions were answered to the patient's satisfaction.     [place], [place]

## 2023-08-20 NOTE — Op Note (Signed)
[facility] Gastroenterology Patient Name: Meagan Calhoun Procedure Date: 08/20/2023 11:04 AM MRN: [id number] Account #: [id number] Date of Birth: 11/11/1955 Admit Type: Outpatient Age: 67 Room: [facility] ENDO ROOM 4 Gender: Female Note Status: Finalized Instrument Name: Upper Endoscope [id number] Procedure:             Upper GI endoscopy Indications:           Pharyngeal phase dysphagia, Esophageal dysphagia,                         Stricture of the esophagus Providers:             Boykin Nearing. Norma Fredrickson MD, MD Referring MD:          Dale [place], MD (Referring MD) Medicines:             Propofol per Anesthesia Complications:         No immediate complications. Estimated blood loss:                         Minimal. Procedure:             Pre-Anesthesia Assessment:                        - The risks and benefits of the procedure and the                         sedation options and risks were discussed with the                         patient. All questions were answered and informed                         consent was obtained.                        - Patient identification and proposed procedure were                         verified prior to the procedure by the nurse. The                         procedure was verified in the procedure room.                        - ASA Grade Assessment: II - A patient with mild                         systemic disease.                        - After reviewing the risks and benefits, the patient                         was deemed in satisfactory condition to undergo the                         procedure.                        After obtaining informed consent,  the endoscope was                         passed under direct vision. Throughout the procedure,                         the patient's blood pressure, pulse, and oxygen                         saturations were monitored continuously. The Endoscope                         was  introduced through the mouth, and advanced to the                         third part of duodenum. The upper GI endoscopy was                         accomplished without difficulty. The patient tolerated                         the procedure well. Findings:      The examined duodenum was normal.      The stomach was normal.      Segmental mild mucosal variance characterized by mild feline appearance       was found in the middle third of the esophagus. Biopsies were taken with       a cold forceps for histology.      One benign-appearing, intrinsic severe stenosis was found at the       cricopharyngeus. This stenosis measured 1.1 cm (inner diameter) x less       than one cm (in length). The stenosis was traversed. A TTS dilator was       passed through the scope. Dilation with a 12-13.5-15 mm balloon dilator       was performed to 15 mm. The dilation site was examined following       endoscope reinsertion and showed moderate improvement in luminal       narrowing. Estimated blood loss was minimal.      One benign-appearing, intrinsic mild stenosis was found in the distal       esophagus. This stenosis measured 1.5 cm (inner diameter) x less than       one cm (in length). The stenosis was traversed. The decision was made to       ablate the distal esophageal stricture in the distal esophagus with       spray cryotherapy. Endoscopic visualization identified the ablation       site, which was circumferential. A guidewire was placed, and the       endoscope was removed. Ventilation tubing was inserted over the       guidewire, and the wire removed. The endoscope was reinserted and the       ablation catheter was inserted via the working channel. A total of one       site was ablated. Liquid nitrogen cryogen was applied for 20 seconds       after the appearance of a frosting effect across the target zone.       Suction-aided ventilation of gases through the ventilation tubing       continued  during therapy and for 30 seconds  thereafter. The ablated area       was allowed to thaw for 60 seconds. Ablation was repeated in a likewise       fashion at each site for a total of two cycles. Ablation details as       follows: Site 1: circumferential pattern at 34 cm from the incisors. No       complications was observed at the conclusion of therapy. The ventilation       tubing was removed. The endoscope was reinserted and the anatomical       areas where the abnormal mucosa had been ablated were examined. There       was no bleeding. A TTS dilator was passed through the scope. Dilation       with a 15-16.5-18 mm balloon dilator was performed to 18 mm. The       dilation site was examined following endoscope reinsertion and showed       complete resolution of luminal narrowing. Estimated blood loss was       minimal. Estimated blood loss was minimal.      The exam was otherwise without abnormality. Impression:            - Normal examined duodenum.                        - Normal stomach.                        - Esophageal mucosal variant. Biopsied.                        - Benign-appearing upper esophageal stenosis. Dilated.                         Location of the stricture near the cricopharyngeus not                         amenable to cryotherapy.                        - Benign-appearing distal esophageal stenosis. Ablated                         with spray cryotherapy using liquid nitrogen. Dilated.                        - The examination was otherwise normal. Recommendation:        - Patient has a contact number available for                         emergencies. The signs and symptoms of potential                         delayed complications were discussed with the patient.                         Return to normal activities tomorrow. Written                         discharge instructions were provided to the patient.                        -  Full liquid diet today.                         - Advance diet as tolerated indefinitely.                        - Return to my office in 1 month.                        - Telephone GI office to schedule appointment.                        - The findings and recommendations were discussed with                         the patient.                        May consider further dilations of the cricopharyngeal                         stricture utilizing steroid injections in the future. Procedure Code(s):     --- Professional ---                        [phone], Esophagogastroduodenoscopy, flexible,                         transoral; with ablation of tumor(s), polyp(s), or                         other lesion(s) (includes pre- and post-dilation and                         guide wire passage, when performed)                        43239, 59, Esophagogastroduodenoscopy, flexible,                         transoral; with biopsy, single or multiple Diagnosis Code(s):     --- Professional ---                        R13.14, Dysphagia, pharyngoesophageal phase                        R13.13, Dysphagia, pharyngeal phase                        K22.2, Esophageal obstruction                        K22.89, Other specified disease of esophagus CPT copyright 2022 American Medical Association. All rights reserved. The codes documented in this report are preliminary and upon coder review may  be revised to meet current compliance requirements. Stanton Kidney MD, MD 08/20/2023 12:22:24 PM This report has been signed electronically. Number of Addenda: 0 Note Initiated On: 08/20/2023 11:04 AM Estimated Blood Loss:  Estimated blood loss was minimal. Estimated blood loss  was minimal. Estimated blood loss was minimal.      [facility]

## 2023-08-22 LAB — SURGICAL PATHOLOGY

## 2023-09-03 ENCOUNTER — Encounter: Payer: Self-pay | Admitting: Internal Medicine

## 2023-11-06 ENCOUNTER — Ambulatory Visit: Payer: 59 | Admitting: Certified Nurse Midwife

## 2023-11-17 ENCOUNTER — Ambulatory Visit (INDEPENDENT_AMBULATORY_CARE_PROVIDER_SITE_OTHER)

## 2023-11-17 ENCOUNTER — Encounter: Payer: Self-pay | Admitting: Internal Medicine

## 2023-11-17 ENCOUNTER — Ambulatory Visit: Payer: Self-pay | Admitting: Internal Medicine

## 2023-11-17 ENCOUNTER — Ambulatory Visit: Admitting: Family Medicine

## 2023-11-17 ENCOUNTER — Ambulatory Visit: Admitting: Internal Medicine

## 2023-11-17 VITALS — BP 106/62 | HR 60 | Temp 99.0°F | Ht 70.0 in | Wt 179.8 lb

## 2023-11-17 DIAGNOSIS — J069 Acute upper respiratory infection, unspecified: Secondary | ICD-10-CM | POA: Diagnosis not present

## 2023-11-17 DIAGNOSIS — J029 Acute pharyngitis, unspecified: Secondary | ICD-10-CM | POA: Diagnosis not present

## 2023-11-17 DIAGNOSIS — R059 Cough, unspecified: Secondary | ICD-10-CM | POA: Diagnosis not present

## 2023-11-17 DIAGNOSIS — M791 Myalgia, unspecified site: Secondary | ICD-10-CM | POA: Diagnosis not present

## 2023-11-17 LAB — POC COVID19 BINAXNOW: SARS Coronavirus 2 Ag: NEGATIVE

## 2023-11-17 LAB — POCT INFLUENZA A/B
Influenza A, POC: NEGATIVE
Influenza B, POC: NEGATIVE

## 2023-11-17 LAB — POCT RAPID STREP A (OFFICE): Rapid Strep A Screen: NEGATIVE

## 2023-11-17 MED ORDER — GUAIFENESIN-DM 100-10 MG/5ML PO SYRP
5.0000 mL | ORAL_SOLUTION | ORAL | 0 refills | Status: DC | PRN
Start: 2023-11-17 — End: 2024-01-27

## 2023-11-17 MED ORDER — BENZONATATE 100 MG PO CAPS
100.0000 mg | ORAL_CAPSULE | Freq: Two times a day (BID) | ORAL | 0 refills | Status: DC | PRN
Start: 2023-11-17 — End: 2024-01-27

## 2023-11-17 NOTE — Assessment & Plan Note (Addendum)
-  Patient presented today with URI symptoms including cough productive of greenish phlegm, chest congestion and myalgias as well as sore throat -Patient's symptoms are likely secondary to an underlying viral illness -Point-of-care flu and COVID test were negative -On exam, she was noted to have slight exudates or parts of her oropharynx and palate.  However, she had no cervical lymphadenopathy, no fevers chills and no odynophagia -Low suspicion for strep throat but will test today -Will continue symptomatic management for now with Tessalon Perles and guaifenesin/dextromethorphan for cough  Addendum: -Rapid strep test was negative -Return precautions given to the patient

## 2023-11-17 NOTE — Telephone Encounter (Signed)
 Red Word that prompted transfer to Nurse Triage: Aches, coughing up greenish mucus, greenish mucus coming out of nose, congestion, dull headache, no fever, fingertips and legs ache.    Chief Complaint: Productive cough with green mucus, body aches Symptoms: Above Frequency: Thursday Pertinent Negatives: Patient denies fever Disposition: [] ED /[] Urgent Care (no appt availability in office) / [x] Appointment(In office/virtual)/ []  [facility]/ [] Home Care/ [] Refused Recommended Disposition /[] [facility]/ []  Follow-up with PCP Additional Notes: Pt. Agrees with appointment.  Reason for Disposition  SEVERE coughing spells (e.g., whooping sound after coughing, vomiting after coughing)  Answer Assessment - Initial Assessment Questions 1. ONSET: "When did the cough begin?"      Thursday 2. SEVERITY: "How bad is the cough today?"      Severe 3. SPUTUM: "Describe the color of your sputum" (none, dry cough; clear, white, yellow, green)     Green 4. HEMOPTYSIS: "Are you coughing up any blood?" If so ask: "How much?" (flecks, streaks, tablespoons, etc.)     No 5. DIFFICULTY BREATHING: "Are you having difficulty breathing?" If Yes, ask: "How bad is it?" (e.g., mild, moderate, severe)    - MILD: No SOB at rest, mild SOB with walking, speaks normally in sentences, can lie down, no retractions, pulse < 100.    - MODERATE: SOB at rest, SOB with minimal exertion and prefers to sit, cannot lie down flat, speaks in phrases, mild retractions, audible wheezing, pulse 100-120.    - SEVERE: Very SOB at rest, speaks in single words, struggling to breathe, sitting hunched forward, retractions, pulse > 120      No 6. FEVER: "Do you have a fever?" If Yes, ask: "What is your temperature, how was it measured, and when did it start?"     no 7. CARDIAC HISTORY: "Do you have any history of heart disease?" (e.g., heart attack, congestive heart failure)      No 8. LUNG HISTORY: "Do you have any  history of lung disease?"  (e.g., pulmonary embolus, asthma, emphysema)     No 9. PE RISK FACTORS: "Do you have a history of blood clots?" (or: recent major surgery, recent prolonged travel, bedridden)     No 10. OTHER SYMPTOMS: "Do you have any other symptoms?" (e.g., runny nose, wheezing, chest pain)       Body aches, headache 11. PREGNANCY: "Is there any chance you are pregnant?" "When was your last menstrual period?"       No 12. TRAVEL: "Have you traveled out of the country in the last month?" (e.g., travel history, exposures)       No  Protocols used: Cough - Acute Productive-A-AH

## 2023-11-17 NOTE — Progress Notes (Signed)
   Acute Office Visit  Subjective:     Patient ID: Meagan Calhoun, female    DOB: 30-Nov-1955, 68 y.o.   MRN: [id number]  Chief Complaint  Patient presents with   Acute Visit    Aches, cough, green mucous, congestion, dull headache, fingertips & legs achy No fever    HPI Patient is in today for URI symptoms.  Patient presented today with 4 days of cough productive of greenish phlegm, chest congestion, dull headache over the back of her head and myalgias worse in her legs as well as hoarseness of voice and a sore throat.  Patient denies any fevers or chills.  No odynophagia.  No shortness of breath.  She did complain of some nasal congestion but no sinus congestion or pain.  Review of Systems  Constitutional:  Positive for malaise/fatigue.  HENT:  Positive for congestion and sore throat. Negative for ear pain and sinus pain.   Respiratory:  Positive for cough and sputum production. Negative for hemoptysis, shortness of breath and wheezing.        Patient does complain of some chest tightness and congestion  Cardiovascular: Negative.   Musculoskeletal:  Positive for myalgias.  Neurological: Negative.   Psychiatric/Behavioral: Negative.          Objective:    Ht 5\' 10"  (1.778 m)   BMI 24.68 kg/m    Physical Exam Constitutional:      General: She is not in acute distress.    Appearance: Normal appearance. She is not toxic-appearing.  HENT:     Head: Normocephalic and atraumatic.     Mouth/Throat:     Pharynx: Oropharyngeal exudate present.     Comments: Whitish exudate noted over her upper palate and oropharynx Cardiovascular:     Rate and Rhythm: Normal rate and regular rhythm.     Heart sounds: Normal heart sounds.  Pulmonary:     Effort: No respiratory distress.     Breath sounds: Normal breath sounds. No wheezing or rales.  Lymphadenopathy:     Cervical: No cervical adenopathy.  Neurological:     Mental Status: She is alert.  Psychiatric:        Mood and  Affect: Mood normal.        Behavior: Behavior normal.     No results found for any visits on 11/17/23.      Assessment & Plan:   Problem List Items Addressed This Visit       Respiratory   URI (upper respiratory infection) - Primary   Relevant Medications   benzonatate (TESSALON) 100 MG capsule   guaiFENesin-dextromethorphan (ROBITUSSIN DM) 100-10 MG/5ML syrup   Other Relevant Orders   DG Chest 2 View    Meds ordered this encounter  Medications   benzonatate (TESSALON) 100 MG capsule    Sig: Take 1 capsule (100 mg total) by mouth 2 (two) times daily as needed for cough.    Dispense:  20 capsule    Refill:  0   guaiFENesin-dextromethorphan (ROBITUSSIN DM) 100-10 MG/5ML syrup    Sig: Take 5 mLs by mouth every 4 (four) hours as needed for cough.    Dispense:  118 mL    Refill:  0    No follow-ups on file.  Earl Lagos, MD

## 2023-11-17 NOTE — Telephone Encounter (Signed)
 Patient is scheduled for an appointment with provider on 11/17/23 at 1:40 PM. Patient verbalized understanding.

## 2023-11-17 NOTE — Patient Instructions (Signed)
-  It was a pleasure meeting you today -I suspect that you likely have a viral upper respiratory tract infection that is causing your symptoms -Will obtain a chest x-ray to rule out an underlying pneumonia but this is unlikely -I prescribed Tessalon Perles and guaifenesin/dextromethorphan to help with your cough -Will also obtain throat swab to rule out strep throat given the whitish exudates at the back of your throat -Please contact us if you have any worsening symptoms or if your symptoms persist and do not improve

## 2023-11-18 ENCOUNTER — Ambulatory Visit: Payer: 59 | Admitting: Certified Nurse Midwife

## 2023-11-18 ENCOUNTER — Ambulatory Visit
Admission: RE | Admit: 2023-11-18 | Discharge: 2023-11-18 | Disposition: A | Discharge status: Home / Self Care | Payer: 59 | Source: Ambulatory Visit | Attending: Internal Medicine | Admitting: Internal Medicine

## 2023-11-18 DIAGNOSIS — Z1231 Encounter for screening mammogram for malignant neoplasm of breast: Secondary | ICD-10-CM | POA: Insufficient documentation

## 2023-11-27 ENCOUNTER — Ambulatory Visit (INDEPENDENT_AMBULATORY_CARE_PROVIDER_SITE_OTHER): Admitting: Certified Nurse Midwife

## 2023-11-27 VITALS — BP 111/62 | HR 52 | Ht 70.0 in | Wt 176.2 lb

## 2023-11-27 DIAGNOSIS — Z01419 Encounter for gynecological examination (general) (routine) without abnormal findings: Secondary | ICD-10-CM | POA: Diagnosis not present

## 2023-11-27 NOTE — Patient Instructions (Signed)

## 2023-11-27 NOTE — Progress Notes (Signed)
 ANNUAL EXAM Patient name: Meagan Calhoun MRN [id number]  Date of birth: [birth year] Chief Complaint:   Annual Exam  History of Present Illness:   Meagan Calhoun is a 68 y.o. G72P0010 Caucasian female being seen today for a routine annual exam.  Current complaints: none, retiring from [employer] after 27 years this summer.  No LMP recorded. Patient is postmenopausal.   The pregnancy intention screening data noted above was reviewed. Potential methods of contraception were discussed. The patient elected to proceed with No data recorded.   No results found for: "DIAGPAP", "HPVHIGH", "ADEQPAP"     Last pap 09/2020. Results were: NILM w/ HRHPV negative. H/O abnormal pap: no Last mammogram: 11/18/2023. Results were: normal. Family h/o breast cancer: yes sister-postmenopausal Last colonoscopy: 2017. Results were: normal. Family h/o colorectal cancer: no     06/26/2022   11:32 AM 03/26/2022    7:38 AM 09/28/2020    2:30 PM 07/05/2019    9:05 AM 04/28/2018    7:58 AM  Depression screen PHQ 2/9  Decreased Interest 0 0 0 0 2  Down, Depressed, Hopeless 0 0 0 0 1  PHQ - 2 Score 0 0 0 0 3  Altered sleeping    0 3  Tired, decreased energy    0 3  Change in appetite    0 0  Feeling bad or failure about yourself     0 1  Trouble concentrating    0 1  Moving slowly or fidgety/restless    0 1  Suicidal thoughts    0 0  PHQ-9 Score    0 12  Difficult doing work/chores    Not difficult at all Very difficult         No data to display            Review of Systems:   Pertinent items are noted in HPI Denies any headaches, blurred vision, fatigue, shortness of breath, chest pain, abdominal pain, abnormal vaginal discharge/itching/odor/irritation, problems with periods, bowel movements, urination, or intercourse unless otherwise stated above. Pertinent History Reviewed:  Reviewed past medical,surgical, social and family history.  Reviewed problem list, medications and  allergies. Physical Assessment:   Vitals:   11/27/23 1340  BP: 111/62  Pulse: (!) 52  Weight: 176 lb 3.2 oz (79.9 kg)  Height: 5\' 10"  (1.778 m)  Body mass index is 25.28 kg/m.       Physical Exam Vitals reviewed.  Constitutional:      Appearance: Normal appearance.  HENT:     Head: Normocephalic.  Neck:     Thyroid: No thyroid mass or thyromegaly.  Cardiovascular:     Rate and Rhythm: Normal rate and regular rhythm.     Heart sounds: Normal heart sounds.  Pulmonary:     Effort: Pulmonary effort is normal.     Breath sounds: Normal breath sounds.  Chest:  Breasts:    Tanner Score is 5.     Right: Normal.     Left: Normal.  Abdominal:     General: Abdomen is flat.     Palpations: Abdomen is soft.     Tenderness: There is no abdominal tenderness.  Musculoskeletal:     Cervical back: Neck supple. No tenderness.  Skin:    General: Skin is warm and dry.  Neurological:     General: No focal deficit present.     Mental Status: She is alert and oriented to person, place, and time.  Psychiatric:  Mood and Affect: Mood normal.        Behavior: Behavior normal.      No results found for this or any previous visit (from the past 24 hours).  Assessment & Plan:  1. Well woman exam (Primary)   Mammogram: in 1 year, or sooner if problems Colonoscopy: per GI, or sooner if problems  No orders of the defined types were placed in this encounter.   Meds: No orders of the defined types were placed in this encounter.   Follow-up: Return in 1 year (on 11/26/2024) for Annual exam.  Dominica Severin, CNM 11/27/2023 3:42 PM

## 2024-01-27 ENCOUNTER — Encounter: Payer: Self-pay | Admitting: Internal Medicine

## 2024-01-27 ENCOUNTER — Ambulatory Visit: Payer: 59 | Admitting: Internal Medicine

## 2024-01-27 VITALS — BP 118/70 | HR 63 | Temp 98.0°F | Resp 16 | Ht 70.0 in | Wt 175.4 lb

## 2024-01-27 DIAGNOSIS — E039 Hypothyroidism, unspecified: Secondary | ICD-10-CM | POA: Diagnosis not present

## 2024-01-27 DIAGNOSIS — R1310 Dysphagia, unspecified: Secondary | ICD-10-CM | POA: Diagnosis not present

## 2024-01-27 DIAGNOSIS — Z803 Family history of malignant neoplasm of breast: Secondary | ICD-10-CM | POA: Diagnosis not present

## 2024-01-27 DIAGNOSIS — F439 Reaction to severe stress, unspecified: Secondary | ICD-10-CM

## 2024-01-27 DIAGNOSIS — R739 Hyperglycemia, unspecified: Secondary | ICD-10-CM | POA: Diagnosis not present

## 2024-01-27 DIAGNOSIS — Z860100 Personal history of colon polyps, unspecified: Secondary | ICD-10-CM

## 2024-01-27 MED ORDER — LEVOTHYROXINE SODIUM 137 MCG PO TABS: ORAL_TABLET | ORAL | 1 refills | Status: DC

## 2024-01-27 MED ORDER — SERTRALINE HCL 100 MG PO TABS: ORAL_TABLET | ORAL | 1 refills | Status: DC

## 2024-01-27 NOTE — Assessment & Plan Note (Signed)
 Overall appears to be handling things well.  Follow.  Continue zoloft . No changes.

## 2024-01-27 NOTE — Assessment & Plan Note (Signed)
 Mammogram 11/18/23 - normal.

## 2024-01-27 NOTE — Assessment & Plan Note (Signed)
 On thyroid replacement.  Follow tsh.

## 2024-01-27 NOTE — Assessment & Plan Note (Signed)
Colonoscopy 10/15/21 - colon polyp - ascending - tubular adenoma.  Recommended f/u in 5 years.   

## 2024-01-27 NOTE — Assessment & Plan Note (Signed)
.   S/p EGD 08/2023 - benign appearing upper and distal esophageal stenosis - dilated (upper) ablated (distal). Her swallowing was better for approximately 6 weeks. Has noticed some return of her swallowing issues. She wants to hold on any further intervention at this time. Discussed eating slowly, taking small bites and chewing food well. Is staying active.

## 2024-01-27 NOTE — Progress Notes (Signed)
 Subjective:    Patient ID: Meagan Calhoun, female    DOB: Feb 03, 1956, 68 y.o.   MRN: [id number]  Patient here for  Chief Complaint  Patient presents with   Medical Management of Chronic Issues    HPI Here for a scheduled follow up. Saw gyn 11/27/23 - last pap 09/2020 - NILM with HRHPV negative. Last mammogram 11/18/23 - normal. Colonoscopy /2023 - ok. Recommended f/u in 5 years. S/p EGD 08/2023 - benign appearing upper and distal esophageal stenosis - dilated (upper) ablated (distal). Her swallowing was better for approximately 6 weeks. Has noticed some return of her swallowing issues. She wants to hold on any further intervention at this time. Discussed eating slowly, taking small bites and chewing food well. Is staying active. Does a lot of physical activity. No chest pain or sob.    Past Medical History:  Diagnosis Date   Abnormal Pap smear of cervix    Anxiety    GERD (gastroesophageal reflux disease)    H/O jaundice    History of chicken pox    History of esophageal stricture 2012   Hx of colonic polyp    Hypothyroidism    Past Surgical History:  Procedure Laterality Date   BIOPSY  08/20/2023   Procedure: BIOPSY;  Surgeon: Corky Diener, Alphonsus Jeans, MD;  Location: [facility] ENDOSCOPY;  Service: Gastroenterology;;   BREAST BIOPSY Left    neg   CERVICAL BIOPSY  W/ LOOP ELECTRODE EXCISION  08/2013   chronic cervicitis   CHOLECYSTECTOMY  11/2006   Dr Hortensia Ma   COLONOSCOPY  2012, 2017   normal   ESOPHAGOGASTRODUODENOSCOPY (EGD) WITH PROPOFOL  N/A 06/22/2015   Procedure: ESOPHAGOGASTRODUODENOSCOPY (EGD) WITH PROPOFOL ;  Surgeon: Cassie Click, MD;  Location: [facility] ENDOSCOPY;  Service: Endoscopy;  Laterality: N/A;   ESOPHAGOGASTRODUODENOSCOPY (EGD) WITH PROPOFOL  N/A 08/20/2023   Procedure: ESOPHAGOGASTRODUODENOSCOPY (EGD) WITH PROPOFOL ;  Surgeon: [place], Alphonsus Jeans, MD;  Location: [facility] ENDOSCOPY;  Service: Gastroenterology;  Laterality: N/A;  Cryotherapy Procedure (1 hour per Dr Corky Diener)    LAPAROTOMY  1988   ectopic pregnancy   SAVORY DILATION N/A 06/22/2015   Procedure: SAVORY DILATION;  Surgeon: Cassie Click, MD;  Location: [facility] ENDOSCOPY;  Service: Endoscopy;  Laterality: N/A;   thyroidectomy Right 2013   partial right thyroidectomy   TUBAL LIGATION     WISDOM TOOTH EXTRACTION     Family History  Problem Relation Age of Onset   Hypothyroidism Mother    Dementia Mother    Heart disease Father        CHF   Breast cancer Sister 59   Alzheimer's disease Sister    Heart attack Brother 79   Colon cancer Neg Hx    Social History   Socioeconomic History   Marital status: Married    Spouse name: Not on file   Number of children: 0   Years of education: 12   Highest education level: Not on file  Occupational History   Not on file  Tobacco Use   Smoking status: Never   Smokeless tobacco: Never  Vaping Use   Vaping status: Never Used  Substance and Sexual Activity   Alcohol use: Yes    Comment: rare   Drug use: No   Sexual activity: Yes    Partners: Male    Birth control/protection: Post-menopausal  Other Topics Concern   Not on file  Social History Narrative   Not on file   Social Drivers of Health   Financial Resource Strain: Not  on file  Food Insecurity: Not on file  Transportation Needs: Not on file  Physical Activity: Insufficiently Active (08/15/2017)   Exercise Vital Sign    Days of Exercise per Week: 4 days    Minutes of Exercise per Session: 30 min  Stress: No Stress Concern Present (08/15/2017)   [employer] of Occupational Health - Occupational Stress Questionnaire    Feeling of Stress : Not at all  Social Connections: Moderately Integrated (08/15/2017)   Social Connection and Isolation Panel [NHANES]    Frequency of Communication with Friends and Family: More than three times a week    Frequency of Social Gatherings with Friends and Family: Once a week    Attends Religious Services: More than 4 times per year    Active  Member of [employer] or Organizations: No    Attends Banker Meetings: Never    Marital Status: Married     Review of Systems  Constitutional:  Negative for appetite change and unexpected weight change.  HENT:  Negative for congestion and sinus pressure.   Respiratory:  Negative for cough, chest tightness and shortness of breath.   Cardiovascular:  Negative for chest pain, palpitations and leg swelling.  Gastrointestinal:  Negative for abdominal pain, diarrhea, nausea and vomiting.  Genitourinary:  Negative for difficulty urinating and dysuria.  Musculoskeletal:  Negative for joint swelling and myalgias.  Skin:  Negative for color change and rash.  Neurological:  Negative for dizziness and headaches.  Psychiatric/Behavioral:  Negative for agitation and dysphoric mood.        Objective:     BP 118/70   Pulse 63   Temp 98 F (36.7 C)   Resp 16   Ht 5\' 10"  (1.778 m)   Wt 175 lb 6.4 oz (79.6 kg)   SpO2 98%   BMI 25.17 kg/m  Wt Readings from Last 3 Encounters:  01/27/24 175 lb 6.4 oz (79.6 kg)  11/27/23 176 lb 3.2 oz (79.9 kg)  11/17/23 179 lb 12.8 oz (81.6 kg)    Physical Exam Vitals reviewed.  Constitutional:      General: She is not in acute distress.    Appearance: Normal appearance.  HENT:     Head: Normocephalic and atraumatic.     Right Ear: External ear normal.     Left Ear: External ear normal.     Mouth/Throat:     Pharynx: No oropharyngeal exudate or posterior oropharyngeal erythema.  Eyes:     General: No scleral icterus.       Right eye: No discharge.        Left eye: No discharge.     Conjunctiva/sclera: Conjunctivae normal.  Neck:     Thyroid : No thyromegaly.  Cardiovascular:     Rate and Rhythm: Normal rate and regular rhythm.  Pulmonary:     Effort: No respiratory distress.     Breath sounds: Normal breath sounds. No wheezing.  Abdominal:     General: Bowel sounds are normal.     Palpations: Abdomen is soft.     Tenderness: There is  no abdominal tenderness.  Musculoskeletal:        General: No swelling or tenderness.     Cervical back: Neck supple. No tenderness.  Lymphadenopathy:     Cervical: No cervical adenopathy.  Skin:    Findings: No erythema or rash.  Neurological:     Mental Status: She is alert.  Psychiatric:        Mood and Affect: Mood normal.  Behavior: Behavior normal.         Outpatient Encounter Medications as of 01/27/2024  Medication Sig   Biotin 10 MG CAPS Take by mouth.   Cyanocobalamin (VITAMIN B-12 PO) Take by mouth.   FOLIC ACID PO Take by mouth.   levothyroxine  (SYNTHROID ) 137 MCG tablet TAKE 1 TABLET(137 MCG) BY MOUTH DAILY BEFORE BREAKFAST   sertraline  (ZOLOFT ) 100 MG tablet TAKE 1 TABLET(100 MG) BY MOUTH DAILY   VITAMIN D  PO Take by mouth.   [DISCONTINUED] benzonatate  (TESSALON ) 100 MG capsule Take 1 capsule (100 mg total) by mouth 2 (two) times daily as needed for cough.   [DISCONTINUED] guaiFENesin -dextromethorphan (ROBITUSSIN DM) 100-10 MG/5ML syrup Take 5 mLs by mouth every 4 (four) hours as needed for cough.   [DISCONTINUED] levothyroxine  (SYNTHROID ) 137 MCG tablet TAKE 1 TABLET(137 MCG) BY MOUTH DAILY BEFORE BREAKFAST   [DISCONTINUED] sertraline  (ZOLOFT ) 100 MG tablet TAKE 1 TABLET(100 MG) BY MOUTH DAILY   No facility-administered encounter medications on file as of 01/27/2024.     Lab Results  Component Value Date   WBC 3.8 12/25/2022   HGB 13.3 12/25/2022   HCT 40.9 12/25/2022   PLT 229 12/25/2022   GLUCOSE 83 07/30/2023   CHOL 180 07/30/2023   TRIG 151 (H) 07/30/2023   HDL 36 (L) 07/30/2023   LDLCALC 117 (H) 07/30/2023   ALT 11 07/30/2023   AST 16 07/30/2023   NA 140 07/30/2023   K 4.4 07/30/2023   CL 103 07/30/2023   CREATININE 0.93 07/30/2023   BUN 17 07/30/2023   CO2 23 07/30/2023   TSH 2.380 12/25/2022   HGBA1C 5.6 07/30/2023    MM 3D SCREENING MAMMOGRAM BILATERAL BREAST Result Date: 11/21/2023 CLINICAL DATA:  Screening. EXAM: DIGITAL SCREENING  BILATERAL MAMMOGRAM WITH TOMOSYNTHESIS AND CAD TECHNIQUE: Bilateral screening digital craniocaudal and mediolateral oblique mammograms were obtained. Bilateral screening digital breast tomosynthesis was performed. The images were evaluated with computer-aided detection. COMPARISON:  Previous exam(s). ACR Breast Density Category b: There are scattered areas of fibroglandular density. FINDINGS: There are no findings suspicious for malignancy. IMPRESSION: No mammographic evidence of malignancy. A result letter of this screening mammogram will be mailed directly to the patient. RECOMMENDATION: Screening mammogram in one year. (Code:SM-B-01Y) BI-RADS CATEGORY  1: Negative. Electronically Signed   By: Alinda Apley M.D.   On: 11/21/2023 07:02       Assessment & Plan:  Dysphagia, unspecified type Assessment & Plan: . S/p EGD 08/2023 - benign appearing upper and distal esophageal stenosis - dilated (upper) ablated (distal). Her swallowing was better for approximately 6 weeks. Has noticed some return of her swallowing issues. She wants to hold on any further intervention at this time. Discussed eating slowly, taking small bites and chewing food well. Is staying active.    Hypothyroidism, unspecified type Assessment & Plan: On thyroid  replacement. Follow tsh.   Orders: -     Lipid panel -     Hepatic function panel -     TSH -     CBC with Differential/Platelet  Hyperglycemia Assessment & Plan: Staying active. Follow met b and A1c.   Orders: -     Basic metabolic panel with GFR -     Hemoglobin A1c -     CBC with Differential/Platelet  Family history of breast cancer Assessment & Plan: Mammogram 11/18/23 - normal.    History of colonic polyps Assessment & Plan: Colonoscopy 10/15/21 - colon polyp - ascending - tubular adenoma.  Recommended f/u in 5 years.  Stress Assessment & Plan: Overall appears to be handling things well.  Follow.  Continue zoloft . No changes.    Other  orders -     Levothyroxine  Sodium; TAKE 1 TABLET(137 MCG) BY MOUTH DAILY BEFORE BREAKFAST  Dispense: 90 tablet; Refill: 1 -     Sertraline  HCl; TAKE 1 TABLET(100 MG) BY MOUTH DAILY  Dispense: 90 tablet; Refill: 1     Dellar Fenton, MD

## 2024-01-27 NOTE — Assessment & Plan Note (Signed)
 Staying active. Follow met b and A1c.

## 2024-01-28 ENCOUNTER — Ambulatory Visit: Payer: Self-pay | Admitting: Internal Medicine

## 2024-01-28 DIAGNOSIS — N289 Disorder of kidney and ureter, unspecified: Secondary | ICD-10-CM

## 2024-01-28 LAB — CBC WITH DIFFERENTIAL/PLATELET
Basophils Absolute: 0 10*3/uL (ref 0.0–0.2)
Basos: 1 %
EOS (ABSOLUTE): 0.2 10*3/uL (ref 0.0–0.4)
Eos: 6 %
Hematocrit: 42.7 % (ref 34.0–46.6)
Hemoglobin: 13 g/dL (ref 11.1–15.9)
Immature Grans (Abs): 0 10*3/uL (ref 0.0–0.1)
Immature Granulocytes: 0 %
Lymphocytes Absolute: 1.2 10*3/uL (ref 0.7–3.1)
Lymphs: 33 %
MCH: 28.1 pg (ref 26.6–33.0)
MCHC: 30.4 g/dL — ABNORMAL LOW (ref 31.5–35.7)
MCV: 92 fL (ref 79–97)
Monocytes Absolute: 0.5 10*3/uL (ref 0.1–0.9)
Monocytes: 15 %
Neutrophils Absolute: 1.6 10*3/uL (ref 1.4–7.0)
Neutrophils: 45 %
Platelets: 222 10*3/uL (ref 150–450)
RBC: 4.62 x10E6/uL (ref 3.77–5.28)
RDW: 13.7 % (ref 11.7–15.4)
WBC: 3.6 10*3/uL (ref 3.4–10.8)

## 2024-01-28 LAB — HEPATIC FUNCTION PANEL
ALT: 13 IU/L (ref 0–32)
AST: 18 IU/L (ref 0–40)
Albumin: 4.2 g/dL (ref 3.9–4.9)
Alkaline Phosphatase: 56 IU/L (ref 44–121)
Bilirubin Total: 0.4 mg/dL (ref 0.0–1.2)
Bilirubin, Direct: 0.11 mg/dL (ref 0.00–0.40)
Total Protein: 6.8 g/dL (ref 6.0–8.5)

## 2024-01-28 LAB — BASIC METABOLIC PANEL WITH GFR
BUN/Creatinine Ratio: 15 (ref 12–28)
BUN: 15 mg/dL (ref 8–27)
CO2: 22 mmol/L (ref 20–29)
Calcium: 9.3 mg/dL (ref 8.7–10.3)
Chloride: 106 mmol/L (ref 96–106)
Creatinine, Ser: 1.01 mg/dL — ABNORMAL HIGH (ref 0.57–1.00)
Glucose: 78 mg/dL (ref 70–99)
Potassium: 4.1 mmol/L (ref 3.5–5.2)
Sodium: 141 mmol/L (ref 134–144)
eGFR: 61 mL/min/{1.73_m2} (ref >59)

## 2024-01-28 LAB — LIPID PANEL
Chol/HDL Ratio: 4 ratio (ref 0.0–4.4)
Cholesterol, Total: 151 mg/dL (ref 100–199)
HDL: 38 mg/dL — ABNORMAL LOW (ref >39)
LDL Chol Calc (NIH): 96 mg/dL (ref 0–99)
Triglycerides: 89 mg/dL (ref 0–149)
VLDL Cholesterol Cal: 17 mg/dL (ref 5–40)

## 2024-01-28 LAB — HEMOGLOBIN A1C
Est. average glucose Bld gHb Est-mCnc: 111 mg/dL
Hgb A1c MFr Bld: 5.5 % (ref 4.8–5.6)

## 2024-01-28 LAB — TSH: TSH: 0.453 u[IU]/mL (ref 0.450–4.500)

## 2024-02-11 ENCOUNTER — Other Ambulatory Visit (INDEPENDENT_AMBULATORY_CARE_PROVIDER_SITE_OTHER)

## 2024-02-11 ENCOUNTER — Ambulatory Visit: Payer: Self-pay | Admitting: Internal Medicine

## 2024-02-11 DIAGNOSIS — N289 Disorder of kidney and ureter, unspecified: Secondary | ICD-10-CM | POA: Diagnosis not present

## 2024-02-11 LAB — BASIC METABOLIC PANEL WITH GFR
BUN: 14 mg/dL (ref 6–23)
CO2: 30 meq/L (ref 19–32)
Calcium: 9.6 mg/dL (ref 8.4–10.5)
Chloride: 106 meq/L (ref 96–112)
Creatinine, Ser: 0.91 mg/dL (ref 0.40–1.20)
GFR: 65.81 mL/min (ref >60.00)
Glucose, Bld: 80 mg/dL (ref 70–99)
Potassium: 4.2 meq/L (ref 3.5–5.1)
Sodium: 139 meq/L (ref 135–145)

## 2024-02-27 ENCOUNTER — Other Ambulatory Visit: Payer: Self-pay | Admitting: Internal Medicine

## 2024-05-20 ENCOUNTER — Telehealth: Payer: Self-pay | Admitting: Internal Medicine

## 2024-05-20 NOTE — Telephone Encounter (Signed)
 LM for patient to go over her form with her.

## 2024-05-20 NOTE — Telephone Encounter (Signed)
 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 It is in Dr [employer] color folder up front. [facility]

## 2024-05-21 ENCOUNTER — Telehealth: Payer: Self-pay

## 2024-05-21 NOTE — Telephone Encounter (Signed)
 Noted. Will f/u with patient Monday.SABRA

## 2024-05-21 NOTE — Telephone Encounter (Signed)
 LM for patient

## 2024-05-21 NOTE — Telephone Encounter (Signed)
 Attempted to reach patient. Phone line busy.

## 2024-05-21 NOTE — Telephone Encounter (Unsigned)
 Copied from CRM [id number]. Topic: Clinical - Medical Advice >> May 21, 2024  8:34 AM Anairis L wrote: Reason for CRM: patient returned [place] phone call.

## 2024-05-21 NOTE — Telephone Encounter (Signed)
 Copied from CRM [id number]. Topic: General - Other >> May 21, 2024 12:38 PM Berneda FALCON wrote: Reason for CRM: Patient is returning phone call to Sueanne to go over the forms together. They keep missing one another when calling.  Please return the phone call to patient at [phone]

## 2024-05-21 NOTE — Telephone Encounter (Signed)
 If required, I am ok ordering.

## 2024-05-21 NOTE — Telephone Encounter (Signed)
 Patient confirmed no limitations with vision, hearing, heart, lungs, carrying or lifting. No acute issues noted. Patient is going to get a TDAP today and will have TB skin test read tomorrow. She is going to come by Monday PM to bring me proof of negative TB so I can complete form.   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 Does not recall having these vaccines. Can we order titers if she is required?

## 2024-05-24 ENCOUNTER — Telehealth: Payer: Self-pay | Admitting: Internal Medicine

## 2024-05-24 NOTE — Telephone Encounter (Signed)
 Pt stopped in to drop off form discussed w/Trisha. Pt states she does not need vaccinations that were also discussed (Hep B and ?). Form is in Dr [employer] color folder up front -kh

## 2024-05-25 NOTE — Telephone Encounter (Signed)
LM for patient. Form placed up front for pick up.

## 2024-05-25 NOTE — Telephone Encounter (Signed)
 Form completed and negative TB results attached. Per patient, she is not required to get MMR or Hep B. Form placed out for signature.

## 2024-05-25 NOTE — Telephone Encounter (Signed)
 See other note

## 2024-05-25 NOTE — Telephone Encounter (Signed)
 Signed and placed in box.

## 2024-06-02 NOTE — Telephone Encounter (Unsigned)
 Copied from CRM #[id number]. Topic: General - Other >> Jun 02, 2024  1:19 PM Robinson H wrote: Reason for CRM: Patient calling to check status of form dropped off at office please reach out with an updated, thanks.  Naydeline [phone] leave message

## 2024-06-03 NOTE — Telephone Encounter (Signed)
 LM for patient to let her know that her form has been ready since 9/9. It is in the folder up front for pick up. Please relay this message when patient returns call.

## 2024-06-04 NOTE — Telephone Encounter (Signed)
 LM for patient

## 2024-06-07 NOTE — Telephone Encounter (Signed)
 LM for patient. Closing note. If she returns call, please let patient know that her form is up front for pick up.

## 2024-08-03 ENCOUNTER — Encounter: Admitting: Internal Medicine

## 2024-09-08 ENCOUNTER — Other Ambulatory Visit: Payer: Self-pay | Admitting: Internal Medicine

## 2024-09-26 ENCOUNTER — Other Ambulatory Visit: Payer: Self-pay | Admitting: Internal Medicine

## 2024-10-19 ENCOUNTER — Encounter: Admitting: Internal Medicine

## 2024-12-14 ENCOUNTER — Encounter: Admitting: Internal Medicine
